# Patient Record
Sex: Female | Born: 1950 | Race: White | Hispanic: No | Marital: Married | Smoking: Current every day smoker
Health system: Southern US, Community
[De-identification: ages and names within clinical notes are randomized; demographics above are authoritative.]

## PROBLEM LIST (undated history)

## (undated) DIAGNOSIS — E785 Hyperlipidemia, unspecified: Secondary | ICD-10-CM

## (undated) DIAGNOSIS — M5430 Sciatica, unspecified side: Secondary | ICD-10-CM

## (undated) DIAGNOSIS — F17209 Nicotine dependence, unspecified, with unspecified nicotine-induced disorders: Secondary | ICD-10-CM

## (undated) DIAGNOSIS — F32A Depression, unspecified: Secondary | ICD-10-CM

## (undated) DIAGNOSIS — F329 Major depressive disorder, single episode, unspecified: Secondary | ICD-10-CM

## (undated) HISTORY — DX: Major depressive disorder, single episode, unspecified: F32.9

## (undated) HISTORY — PX: OTHER SURGICAL HISTORY: SHX169

## (undated) HISTORY — DX: Nicotine dependence, unspecified, with unspecified nicotine-induced disorders: F17.209

## (undated) HISTORY — DX: Sciatica, unspecified side: M54.30

## (undated) HISTORY — DX: Hyperlipidemia, unspecified: E78.5

## (undated) HISTORY — DX: Depression, unspecified: F32.A

---

## 1998-03-16 ENCOUNTER — Other Ambulatory Visit: Admission: RE | Admit: 1998-03-16 | Discharge: 1998-03-16 | Payer: Self-pay | Admitting: *Deleted

## 1999-08-09 ENCOUNTER — Other Ambulatory Visit: Admission: RE | Admit: 1999-08-09 | Discharge: 1999-08-09 | Payer: Self-pay | Admitting: *Deleted

## 2001-02-11 ENCOUNTER — Other Ambulatory Visit: Admission: RE | Admit: 2001-02-11 | Discharge: 2001-02-11 | Payer: Self-pay | Admitting: *Deleted

## 2005-10-13 ENCOUNTER — Ambulatory Visit (HOSPITAL_COMMUNITY): Admission: RE | Admit: 2005-10-13 | Discharge: 2005-10-13 | Payer: Self-pay | Admitting: Family Medicine

## 2005-10-13 ENCOUNTER — Emergency Department (HOSPITAL_COMMUNITY): Admission: EM | Admit: 2005-10-13 | Discharge: 2005-10-13 | Payer: Self-pay | Admitting: Family Medicine

## 2006-04-07 ENCOUNTER — Ambulatory Visit (HOSPITAL_BASED_OUTPATIENT_CLINIC_OR_DEPARTMENT_OTHER): Admission: RE | Admit: 2006-04-07 | Discharge: 2006-04-07 | Payer: Self-pay | Admitting: Orthopedic Surgery

## 2006-05-19 ENCOUNTER — Encounter: Admission: RE | Admit: 2006-05-19 | Discharge: 2006-05-19 | Payer: Self-pay | Admitting: Sports Medicine

## 2006-05-28 ENCOUNTER — Inpatient Hospital Stay (HOSPITAL_COMMUNITY): Admission: RE | Admit: 2006-05-28 | Discharge: 2006-05-30 | Payer: Self-pay | Admitting: Orthopedic Surgery

## 2008-03-22 ENCOUNTER — Emergency Department (HOSPITAL_COMMUNITY): Admission: EM | Admit: 2008-03-22 | Discharge: 2008-03-22 | Payer: Self-pay | Admitting: Family Medicine

## 2010-12-20 NOTE — Op Note (Signed)
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   PROCEDURES:  1. Left subtalar fusion.  2. Left calcaneocuboid fusion.   SURGEON:  Nadara Mustard, MD.   ANESTHESIA:  General plus popliteal block.   ESTIMATED BLOOD LOSS:  Minimal.   ANTIBIOTICS:  1 g of Kefzol.   DRAINS:  None.   COMPLICATIONS:  None.   TOURNIQUET TIME:  Esmarch to the ankle for approximately 51 minutes.   DISPOSITION:  To PACU in stable condition.   INDICATIONS FOR PROCEDURE:  The patient is a 60 year old woman who presents  7 months status post a calcaneus fracture.  This was treated nonoperatively  and the patient presents at this time for evaluation and treatment of the  subtalar arthrosis which has developed from her calcaneal fracture.  Radiographs and CT scan showed degenerative changes of the posterior facette  as well as the calcaneocuboid joint.  She has failed conservative care, has  pain with activities of daily living and wishes to proceed with a fusion at  this time.  Risks and benefits were discussed including infection,  neurovascular injury, persistent pain, nonhealing of the wound, nonhealing  of bone and need for additional surgery.  The patient states she understands  and wished to proceed.   DESCRIPTION OF PROCEDURE:  The patient was brought to OR room #4 and  underwent general anesthetic.  After adequate level of anesthesia was  obtained, the patient's left lower extremity was prepped  using DuraPrep,  draped into a sterile field.  An [name] was used to cover all exposed skin.  An oblique incision was made over the sinus tarsi.  This was carried down,  the extensor muscles were elevated and retracted distally.  The  calcaneocuboid joint was identified and articular cartilage was removed from  the calcaneocuboid joint.  The lateral wall which was prominent laterally  was excised to flatten the lateral wall of the calcaneus.  The posterior  facette was also then taken down with articular cartilage removed from the  posterior facette as well as the subtalar joint.  This was irrigated with  normal saline.  Her foot was held plantigrade with about 5 degrees of valgus  and a stab incision was made in the heel.  He had a guidewire was inserted  from the calcaneus into the talar head and C-arm fluoroscopy verified  reduction of the posterior facette.  The screw was then countersunk and a 60  mm screw was inserted across the posterior facette.  Attention was then  focused on the calcaneocuboid joint.  A 2.5  drill bit was placed across the  calcaneocuboid joint and a 50 mm 3.5 cortical screw was inserted.  C-arm  arthroscopy verified reduction AP and lateral planes of the posterior  facette as well as the calcaneocuboid joint.  The wound was irrigated with  normal saline.  C-arm fluoroscopy verified reduction.  The joint was then  packed with [place] demineralized bone matrix.  The muscle was then  reapproximated over the wound with this being closed with 2-0 Vicryl.  The  incision was closed with a modified vertical mattress with 2-0 nylon.  The  wound was covered Adaptic, orthopedic sponges, sterile Webril and a Coban  dressing.  The patient was extubated and taken to PACU in stable condition.      Nadara Mustard, MD  Electronically Signed     MVD/MEDQ  D:  05/28/2006  T:  05/29/2006  Job:  [id number]

## 2010-12-20 NOTE — Op Note (Signed)
NAMEARELIA, VOLPE                ACCOUNT NO.:  [id number]   MEDICAL RECORD NO.:  [id number]          PATIENT TYPE:  AMB   LOCATION:  DSC                          FACILITY:  [facility]   PHYSICIAN:  Katy Fitch. Sypher, M.D. 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   OPERATIONS:  1. Release of right thumb A1 pulley.  2. Release of the right first dorsal compartment at wrist.  Note these      were procedures through separate incisions.   OPERATIONS:  Dr. Josephine Igo.   ASSISTANT:  Annye Rusk, PA-C.   ANESTHESIA:  Infraclavicular block.  Supervising anesthesiologist Dr.  Krista Blue.   INDICATIONS:  [name] is a 60 year old woman who was self-referred for  evaluation and management of a painful right thumb and painful right wrist.  Clinical examination revealed rather dramatic swelling over the A1 pulley of  her right thumb, due to chronic stenosing tenosynovitis, and significant  swelling over her right first dorsal compartment.   She had pain with any attempted flexion of her right thumb IP joint, and  pain with ulnar deviation of her wrist and/or Finkelstein's maneuver.   After informed consent, she is brought to the operating at this time for  release of her first dorsal compartment and A1 pulley.   PROCEDURE:  Trenity Pha is brought to the operating room and placed in  supine position on the operating table.  Following light sedation, an  axillary block had been placed in the holding area by Dr. Krista Blue.  Within 30  minutes Ms. Laverne had excellent anesthesia in the right  arm.   The right arm was prepped with Betadine soap solution, and sterilely draped.  A pneumatic tourniquet was applied to the proximal right brachium.   Following exsanguination of the right arm with an Esmarch bandage, the  arterial tourniquet was inflated to 220 mmHg.  The procedure commenced with  a short transverse incision directly over the palpably thickened thumb A1  pulley.   Subcutaneous tissues were carefully divided, revealing a very large nodule  of hyalinized flexor sheath.  The area of inflammation and fibrosis measured  more than 4 mm in thickness.  With careful dissection, the radial proper  digital nerve of the thumb was gently retracted in a radial direction and  this thickened hyalinized mass carefully dissected.  The A1 pulley was  beneath the mass.  The mass was circumferentially dissected, followed by  incision in a longitudinal manner down the midline of the mass.  Ultimately,  the A1 pulley was identified and the flexor tendon identified.  There was a  narrow constriction where the flexor pollicis longus tendon had been trapped  for quite a while beneath the pulley.  The tendon was fraying due to a  compressive tendinopathy.  Areas of frank fragmentation of the tendon were  removed with scissors and rongeur dissection.  Thereafter, free IP motion  was recovered.  The wound was then repaired with intradermal 3-0 Prolene and  a Steri-Strip.   Attention directed the first dorsal compartment.   A short transverse incision was fashioned directly over the apex of the  swollen compartment.  Subcutaneous tissues were carefully divided, taking  care to identify the radial sensory branches.  These were dissected free  from the edematous subcutaneous tissues and retracted.  The anatomy of the  first dorsal compartment was obliterated by swollen hyalinized tissue.  This  was incised longitudinally, through a wall thickness of 4 mm, down to the  first dorsal compartment.   The abductor pollicis longus was noted to have  two tendon slips which were identified on the volar aspect of the  compartment, and a formal septum was encountered separating the extensor  pollicis brevis.  The septum between the EPB and APL tendons was completely  resected.  There was noted to be about 3 mm indentation on the abductor  pollicis longus tendons due to the severe stenosis created by the swelling  of the first dorsal compartment annular ligament.   Thereafter, free range of motion of the wrist was recovered.  The wound was  then repaired with intradermal 3-0 Prolene and a Steri-Strip.  There were no  apparent complications.   AFTERCARE:  She is provided prescription for Vicodin 5 mg one p.o. q.4-6  hours p.r.n. pain, 20 tablets without refill.      Katy Fitch Sypher, M.D.  Electronically Signed     RVS/MEDQ  D:  04/07/2006  T:  04/07/2006  Job:  [id number]   cc:   Katy Fitch. Sypher, M.D.

## 2010-12-20 NOTE — Discharge Summary (Signed)
[name], [name] NO.:  [id number]   MEDICAL RECORD NO.:  [id number]          PATIENT TYPE:  INP   LOCATION:  5021                         FACILITY:  [facility]   PHYSICIAN:  Nadara Mustard, MD     DATE OF BIRTH:  08/07/1950   DATE OF ADMISSION:  05/28/2006  DATE OF DISCHARGE:  05/30/2006                               DISCHARGE SUMMARY   FINAL DIAGNOSIS:  Calcaneal fracture with subtalar and calcaneocuboid  arthrosis.   PROCEDURE:  Left foot subtalar and calcaneocuboid fusion.   DISPOSITION:  Discharged to home in stable condition.  Follow up in the  office in two weeks.   HISTORY OF PRESENT ILLNESS:  The patient is a 60 year old woman, who is  seven months status post a calcaneus fracture which was treated closed.  She developed subtalar arthrosis and due to failed conservative care the  patient presented at this time for surgical intervention.  The patient's  hospital course was essentially unremarkable.  She underwent a subtalar  and calcaneocuboid fusion on October 25.  She received Kefzol for  infection prophylaxis.  The patient was able to ambulate independently  with protected weightbearing on the left.  The patient was discharged to  home in stable condition on October 27 with followup care in the office  in one week.      Nadara Mustard, MD  Electronically Signed     MVD/MEDQ  D:  06/26/2006  T:  06/26/2006  Job:  [id number]

## 2016-10-07 DIAGNOSIS — R030 Elevated blood-pressure reading, without diagnosis of hypertension: Secondary | ICD-10-CM | POA: Insufficient documentation

## 2016-10-07 DIAGNOSIS — F419 Anxiety disorder, unspecified: Secondary | ICD-10-CM | POA: Insufficient documentation

## 2016-10-07 DIAGNOSIS — E785 Hyperlipidemia, unspecified: Secondary | ICD-10-CM | POA: Insufficient documentation

## 2016-10-07 DIAGNOSIS — F17210 Nicotine dependence, cigarettes, uncomplicated: Secondary | ICD-10-CM | POA: Insufficient documentation

## 2017-05-26 ENCOUNTER — Encounter: Payer: Self-pay | Admitting: Family Medicine

## 2017-05-26 ENCOUNTER — Ambulatory Visit (INDEPENDENT_AMBULATORY_CARE_PROVIDER_SITE_OTHER): Payer: Medicare Other | Admitting: Family Medicine

## 2017-05-26 VITALS — BP 122/82 | HR 74 | Ht 64.25 in | Wt 128.1 lb

## 2017-05-26 DIAGNOSIS — R030 Elevated blood-pressure reading, without diagnosis of hypertension: Secondary | ICD-10-CM | POA: Diagnosis not present

## 2017-05-26 DIAGNOSIS — Z13220 Encounter for screening for lipoid disorders: Secondary | ICD-10-CM

## 2017-05-26 DIAGNOSIS — Z720 Tobacco use: Secondary | ICD-10-CM | POA: Diagnosis not present

## 2017-05-26 DIAGNOSIS — Z23 Encounter for immunization: Secondary | ICD-10-CM

## 2017-05-26 DIAGNOSIS — M5432 Sciatica, left side: Secondary | ICD-10-CM | POA: Diagnosis not present

## 2017-05-26 LAB — COMPREHENSIVE METABOLIC PANEL
ALBUMIN: 4.4 g/dL (ref 3.5–5.2)
ALK PHOS: 69 U/L (ref 39–117)
ALT: 13 U/L (ref 0–35)
AST: 22 U/L (ref 0–37)
BUN: 13 mg/dL (ref 6–23)
CHLORIDE: 101 meq/L (ref 96–112)
CO2: 32 mEq/L (ref 19–32)
CREATININE: 0.68 mg/dL (ref 0.40–1.20)
Calcium: 9.7 mg/dL (ref 8.4–10.5)
GFR: 92.25 mL/min (ref >60.00)
Glucose, Bld: 97 mg/dL (ref 70–99)
Potassium: 4 mEq/L (ref 3.5–5.1)
SODIUM: 138 meq/L (ref 135–145)
TOTAL PROTEIN: 7 g/dL (ref 6.0–8.3)
Total Bilirubin: 0.6 mg/dL (ref 0.2–1.2)

## 2017-05-26 LAB — CBC WITH DIFFERENTIAL/PLATELET
BASOS ABS: 0 10*3/uL (ref 0.0–0.1)
Basophils Relative: 0.4 % (ref 0.0–3.0)
EOS ABS: 0 10*3/uL (ref 0.0–0.7)
Eosinophils Relative: 0.4 % (ref 0.0–5.0)
HCT: 41.4 % (ref 36.0–46.0)
HEMOGLOBIN: 13.9 g/dL (ref 12.0–15.0)
Lymphocytes Relative: 20.7 % (ref 12.0–46.0)
Lymphs Abs: 2.2 10*3/uL (ref 0.7–4.0)
MCHC: 33.5 g/dL (ref 30.0–36.0)
MCV: 92.7 fl (ref 78.0–100.0)
MONO ABS: 0.8 10*3/uL (ref 0.1–1.0)
Monocytes Relative: 7.8 % (ref 3.0–12.0)
Neutro Abs: 7.7 10*3/uL (ref 1.4–7.7)
Neutrophils Relative %: 70.7 % (ref 43.0–77.0)
Platelets: 317 10*3/uL (ref 150.0–400.0)
RBC: 4.47 Mil/uL (ref 3.87–5.11)
RDW: 13.7 % (ref 11.5–15.5)
WBC: 10.8 10*3/uL — AB (ref 4.0–10.5)

## 2017-05-26 LAB — LIPID PANEL
Cholesterol: 148 mg/dL (ref 0–200)
HDL: 64.7 mg/dL (ref >39.00)
LDL CALC: 66 mg/dL (ref 0–99)
NONHDL: 83.3
Total CHOL/HDL Ratio: 2
Triglycerides: 85 mg/dL (ref 0.0–149.0)
VLDL: 17 mg/dL (ref 0.0–40.0)
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
## 2017-05-26 NOTE — Patient Instructions (Addendum)
Call your insurance company to ask about coverage for Cologaurd.  Then call back to the clinic if you would like me to order it.Immunization Schedule, Adult Recommended immunizations These include:  Influenza vaccine. ? All adults should be immunized every year. ? All adults, including pregnant women and people with hives-only allergy to eggs can receive the inactivated influenza vaccine (IIV) or recombinant influenza vaccine (RIV). ? Adults aged 65-64 years can receive the IIV or RIV. The RIV vaccine does not contain any egg protein. ? Adults aged 56 years or older can receive the high-dose or adjuvanted IIV.  Tetanus, diphtheria, and acellular pertussis (Td, Tdap) vaccine. ? Pregnant women should receive 1 dose of Tdap vaccine during each pregnancy. The dose should be obtained regardless of the length of time since the last dose. Immunization is preferred during the 27th to 36th week of gestation. ? An adult who has not previously received Tdap or who does not know his or her vaccine status should receive 1 dose of Tdap. This initial dose should be followed by tetanus and diphtheria toxoids (Td) booster doses every 10 years. ? Adults with an unknown or incomplete history of completing a 3-dose immunization series with Td-containing vaccines should begin or complete a primary immunization series including a Tdap dose. The first 2 doses should be received at least 4 weeks apart, and the third dose 6-12 months after the second dose. ? Adults should receive a Td booster every 10 years.  Varicella vaccine. ? An adult without evidence of immunity to varicella should receive 2 doses 4-8 weeks apart, or a second dose if he or she has previously received 1 dose. ? Pregnant females who do not have evidence of immunity should receive the first dose after pregnancy. This first dose should be obtained before leaving the health care facility. The second dose should be obtained 4-8 weeks after the first  dose. ? All healthcare workers should have evidence of immunity to varicella. ? Adults with cancer or those who are on therapy to suppress the immune system should not receive the varicella vaccine.  Human papillomavirus (HPV) vaccine. ? Females aged 13-26 years who have not received the vaccine previously should obtain the 3-dose series. Females should receive the second dose 1-2 months after the first dose, and the third dose 6 months after the first dose. ? The vaccine is not recommended for use in pregnant females. However, pregnancy testing is not needed before receiving a dose. If a female is found to be pregnant after receiving a dose, no treatment is needed. In that case, the remaining doses should be delayed until after the pregnancy. ? Males aged 43-21 years who have not received the vaccine previously should receive the 3-dose series. Males aged 22-26 years may also receive a 3 dose series. Males should receive the second dose 1-2 months after the first dose, and the third dose 6 months after the first dose. ? Adult females up to age 24 years and adult males up to age 66 years who initiated the HPV vaccine series before age 68 years and received 2 doses at least 5 months apart do not need an additional dose of the vaccine. ? Adult females up to age 75 years and adult female up to age 36 years who initiated the HPV vaccine series before 15 years but only received 1 dose or 2 doses less than 5 months apart should receive 1 additional dose of the vaccine. ? Immunization is recommended through the age of  14 years for any female who has sex with males and did not get any or all doses earlier. ? Immunization is recommended for any person with an immunocompromised condition through the age of 35 years if he or she did not get any or all doses earlier.  Zoster vaccine. ? One dose is recommended for adults aged 19 years or older unless certain conditions are present.  Measles, mumps, and rubella (MMR)  vaccine. ? Adults born before [birth year] generally are considered immune to measles and mumps. ? Adults born in [birth year] or later should have 1 or more doses of MMR vaccine unless there is a contraindication to the vaccine or there is laboratory evidence of immunity to each of the three diseases. ? A routine second dose of MMR vaccine should be obtained at least 28 days after the first dose for students attending postsecondary schools, health care workers, or international travelers. ? People who received inactivated measles vaccine or an unknown type of measles vaccine during 1963-1967 should be revaccinated with 1 or 2 doses of MMR vaccine. ? People who received inactivated mumps vaccine or an unknown type of mumps vaccine before 1979 and are at high risk for mumps infection should consider immunization with 2 doses of MMR vaccine. ? For females of childbearing age, rubella immunity should be determined. If there is no evidence of immunity, females who are not pregnant should receive 1 dose of MMR. If there is no evidence of immunity, females who are pregnant should receive 1 dose of MMR after pregnancy and before leaving the healthcare facility. ? Unvaccinated health care workers born before [birth year] who lack laboratory evidence of measles, mumps, or rubella immunity or laboratory confirmation of disease should consider 2 doses of MMR 18 days apart for measles or mumps, or 1 dose of MMR for rubella.  Pneumococcal vaccines. ? All adults aged 19 years and older should receive 13-valent pneumococcal conjugate vaccine (PCV13) followed by 23-valent pneumococcal polyscaccharide vaccine (PPSV23) at least 1 year after PCV13. ? An adult aged 54 years or older who has certain medical conditions and has not been previously immunized should receive 1 dose of PCV13 vaccine. This PCV13 should be followed with a dose of pneumococcal polysaccharide (PPSV23) vaccine. The PPSV23 vaccine dose should be obtained at least 8 weeks after  the dose of PCV13 vaccine. ? An adult aged 90 years or older who has certain medical conditions and previously received 1 or more doses of PPSV23 vaccine should receive 1 dose of PCV13. The PCV13 vaccine dose should be obtained 1 or more years after the last PPSV23 vaccine dose. ? PPSV23 vaccination should happen in all adults aged 19-64 who smoke cigarettes. ? People with an immunocompromised condition and certain other conditions should receive both PCV13 and PPSV23 vaccines. ? When indicated, people who have unknown immunization and have no record of immunization should receive PPSV23 vaccine. ? People who received 1-2 doses of PPSV23 before age 69 years should receive another dose of PPSV23 vaccine at age 40 years or later if at least 5 years have passed since the previous dose. ? Doses of PPSV23 are not needed for people immunized with PPSV23 at or after age 23 years.  Meningococcal vaccine. ? Adults with asplenia or persistent complement component deficiencies should receive 2 doses of quadrivalent meningococcal conjugate (MenACWY-D) vaccine. The doses should be obtained at least 2 months apart. Revaccination should occur every 5 years. A 2-dose or 3-dose series of serogroup B meningococcal (MenB)vaccine should also be  obtained. ? Microbiologists working with certain meningococcal bacteria, [employer] recruits, people at risk during an outbreak, and people who travel to or live in countries with a high rate of meningitis should be immunized. Revaccination is recommended every 5 years if the risk for infection remains. ? Adults with HIV infection who have not been previously vaccinated should receive a 2-dose MenACWY series with doses at least 2 months apart. If 1 dose was received previously, a second dose should be obtained at least 2 months later. Revaccination is recommended every 5 years. ? A first-year college student up through age 44 years who is living in a residence hall should receive a  dose if he or she did not receive a dose on or after his or her 16th birthday. ? Adults aged 37-23 years may receive 2 doses of MenB vaccine for short-term protection against most strains of serogroup B meningococcal disease.  Hepatitis A vaccine. ? Adults who wish to be protected from this disease, have certain high-risk conditions, work with hepatitis A-infected animals, work in hepatitis A research labs, or travel to or work in countries with a high rate of hepatitis A should be immunized. ? Adults who were previously unvaccinated and who anticipate close contact with an international adoptee during the first 60 days after arrival in the [place] from a country with a high rate of hepatitis A should be immunized. ? The vaccine may be given as a 2 or 3-dose series by itself or in combination with the hepatitis B vaccine (HepB).  Hepatitis B vaccine. ? Adults who wish to be protected from this disease, may be exposed to blood or other infectious body fluids, are household contacts or sex partners of hepatitis B positive people, are clients or workers in certain care facilities, or travel to or work in countries with a high rate of hepatitis B should be immunized. ? Adults with chronic liver disease, such as hepatitis C infection, cirrhosis, fatty liver disease, alcoholic liver disease, autoimmune hepatitis, and elevated liver chemistry levels should be vaccinated. ? Pregnant women who are at risk for hepatitis B virus infection during pregnancy should be immunized. ? The vaccine may be given as a 3-dose series by itself or in combination with the hepatitis A vaccine (HepA).  Haemophilus influenzae type b (Hib) vaccine. ? A previously unvaccinated person with asplenia or sickle cell disease or having a scheduled splenectomy should receive 1 dose of Hib vaccine. This should happen at least 14 days before the procedure. ? Regardless of previous immunization, a recipient of a hematopoietic stem  cell transplant should receive a 3-dose series, with at least 4 weeks between doses, 6-12 months after his or her successful transplant. ? Hib vaccine is not recommended for adults with HIV infection.  This information is not intended to replace advice given to you by your health care provider. Make sure you discuss any questions you have with your health care provider. Document Released: 10/11/2003 Document Revised: 04/02/2016 Document Reviewed: 04/02/2016 Elsevier Interactive Patient Education  2018 [place] 65 Years and Older, Female Preventive care refers to lifestyle choices and visits with your health care provider that can promote health and wellness. What does preventive care include?  A yearly physical exam. This is also called an annual well check.  Dental exams once or twice a year.  Routine eye exams. Ask your health care provider how often you should have your eyes checked.  Personal lifestyle choices, including: ? Daily care of  your teeth and gums. ? Regular physical activity. ? Eating a healthy diet. ? Avoiding tobacco and drug use. ? Limiting alcohol use. ? Practicing safe sex. ? Taking low-dose aspirin every day. ? Taking vitamin and mineral supplements as recommended by your health care provider. What happens during an annual well check? The services and screenings done by your health care provider during your annual well check will depend on your age, overall health, lifestyle risk factors, and family history of disease. Counseling Your health care provider may ask you questions about your:  Alcohol use.  Tobacco use.  Drug use.  Emotional well-being.  Home and relationship well-being.  Sexual activity.  Eating habits.  History of falls.  Memory and ability to understand (cognition).  Work and work Statistician.  Reproductive health.  Screening You may have the following tests or measurements:  Height, weight, and  BMI.  Blood pressure.  Lipid and cholesterol levels. These may be checked every 5 years, or more frequently if you are over 37 years old.  Skin check.  Lung cancer screening. You may have this screening every year starting at age 21 if you have a 30-pack-year history of smoking and currently smoke or have quit within the past 15 years.  Fecal occult blood test (FOBT) of the stool. You may have this test every year starting at age 55.  Flexible sigmoidoscopy or colonoscopy. You may have a sigmoidoscopy every 5 years or a colonoscopy every 10 years starting at age 57.  Hepatitis C blood test.  Hepatitis B blood test.  Sexually transmitted disease (STD) testing.  Diabetes screening. This is done by checking your blood sugar (glucose) after you have not eaten for a while (fasting). You may have this done every 1-3 years.  Bone density scan. This is done to screen for osteoporosis. You may have this done starting at age 72.  Mammogram. This may be done every 1-2 years. Talk to your health care provider about how often you should have regular mammograms.  Talk with your health care provider about your test results, treatment options, and if necessary, the need for more tests. Vaccines Your health care provider may recommend certain vaccines, such as:  Influenza vaccine. This is recommended every year.  Tetanus, diphtheria, and acellular pertussis (Tdap, Td) vaccine. You may need a Td booster every 10 years.  Varicella vaccine. You may need this if you have not been vaccinated.  Zoster vaccine. You may need this after age 8.  Measles, mumps, and rubella (MMR) vaccine. You may need at least one dose of MMR if you were born in 1957 or later. You may also need a second dose.  Pneumococcal 13-valent conjugate (PCV13) vaccine. One dose is recommended after age 56.  Pneumococcal polysaccharide (PPSV23) vaccine. One dose is recommended after age 31.  Meningococcal vaccine. You may need  this if you have certain conditions.  Hepatitis A vaccine. You may need this if you have certain conditions or if you travel or work in places where you may be exposed to hepatitis A.  Hepatitis B vaccine. You may need this if you have certain conditions or if you travel or work in places where you may be exposed to hepatitis B.  Haemophilus influenzae type b (Hib) vaccine. You may need this if you have certain conditions.  Talk to your health care provider about which screenings and vaccines you need and how often you need them. This information is not intended to replace advice given to you  by your health care provider. Make sure you discuss any questions you have with your health care provider. Document Released: 08/17/2015 Document Revised: 04/09/2016 Document Reviewed: 05/22/2015 Elsevier Interactive Patient Education  2017 [employer]  Sciatica Sciatica is pain, numbness, weakness, or tingling along your sciatic nerve. The sciatic nerve starts in the lower back and goes down the back of each leg. Sciatica happens when this nerve is pinched or has pressure put on it. Sciatica usually goes away on its own or with treatment. Sometimes, sciatica may keep coming back (recur). Follow these instructions at home: Medicines  Take over-the-counter and prescription medicines only as told by your doctor.  Do not drive or use heavy machinery while taking prescription pain medicine. Managing pain  If directed, put ice on the affected area. ? Put ice in a plastic bag. ? Place a towel between your skin and the bag. ? Leave the ice on for 20 minutes, 2-3 times a day.  After icing, apply heat to the affected area before you exercise or as often as told by your doctor. Use the heat source that your doctor tells you to use, such as a moist heat pack or a heating pad. ? Place a towel between your skin and the heat source. ? Leave the heat on for 20-30 minutes. ? Remove the heat if your skin turns  bright red. This is especially important if you are unable to feel pain, heat, or cold. You may have a greater risk of getting burned. Activity  Return to your normal activities as told by your doctor. Ask your doctor what activities are safe for you. ? Avoid activities that make your sciatica worse.  Take short rests during the day. Rest in a lying or standing position. This is usually better than sitting to rest. ? When you rest for a long time, do some physical activity or stretching between periods of rest. ? Avoid sitting for a long time without moving. Get up and move around at least one time each hour.  Exercise and stretch regularly, as told by your doctor.  Do not lift anything that is heavier than 10 lb (4.5 kg) while you have symptoms of sciatica. ? Avoid lifting heavy things even when you do not have symptoms. ? Avoid lifting heavy things over and over.  When you lift objects, always lift in a way that is safe for your body. To do this, you should: ? Bend your knees. ? Keep the object close to your body. ? Avoid twisting. General instructions  Use good posture. ? Avoid leaning forward when you are sitting. ? Avoid hunching over when you are standing.  Stay at a healthy weight.  Wear comfortable shoes that support your feet. Avoid wearing high heels.  Avoid sleeping on a mattress that is too soft or too hard. You might have less pain if you sleep on a mattress that is firm enough to support your back.  Keep all follow-up visits as told by your doctor. This is important. Contact a doctor if:  You have pain that: ? Wakes you up when you are sleeping. ? Gets worse when you lie down. ? Is worse than the pain you have had in the past. ? Lasts longer than 4 weeks.  You lose weight for without trying. Get help right away if:  You cannot control when you pee (urinate) or poop (have a bowel movement).  You have weakness in any of these areas and it gets worse. ?  Lower  back. ? Lower belly (pelvis). ? Butt (buttocks). ? Legs.  You have redness or swelling of your back.  You have a burning feeling when you pee. This information is not intended to replace advice given to you by your health care provider. Make sure you discuss any questions you have with your health care provider. Document Released: 04/29/2008 Document Revised: 12/27/2015 Document Reviewed: 03/30/2015 Elsevier Interactive Patient Education  2018 [employer].  Sciatica Rehab Ask your health care provider which exercises are safe for you. Do exercises exactly as told by your health care provider and adjust them as directed. It is normal to feel mild stretching, pulling, tightness, or discomfort as you do these exercises, but you should stop right away if you feel sudden pain or your pain gets worse.Do not begin these exercises until told by your health care provider. Stretching and range of motion exercises These exercises warm up your muscles and joints and improve the movement and flexibility of your hips and your back. These exercises also help to relieve pain, numbness, and tingling. Exercise A: Sciatic nerve glide 1. Sit in a chair with your head facing down toward your chest. Place your hands behind your back. Let your shoulders slump forward. 2. Slowly straighten one of your knees while you tilt your head back as if you are looking toward the ceiling. Only straighten your leg as far as you can without making your symptoms worse. 3. Hold for __________ seconds. 4. Slowly return to the starting position. 5. Repeat with your other leg. Repeat __________ times. Complete this exercise __________ times a day. Exercise B: Knee to chest with hip adduction and internal rotation  1. Lie on your back on a firm surface with both legs straight. 2. Bend one of your knees and move it up toward your chest until you feel a gentle stretch in your lower back and buttock. Then, move your knee toward the  shoulder that is on the opposite side from your leg. ? Hold your leg in this position by holding onto the front of your knee. 3. Hold for __________ seconds. 4. Slowly return to the starting position. 5. Repeat with your other leg. Repeat __________ times. Complete this exercise __________ times a day. Exercise C: Prone extension on elbows  1. Lie on your abdomen on a firm surface. A bed may be too soft for this exercise. 2. Prop yourself up on your elbows. 3. Use your arms to help lift your chest up until you feel a gentle stretch in your abdomen and your lower back. ? This will place some of your body weight on your elbows. If this is uncomfortable, try stacking pillows under your chest. ? Your hips should stay down, against the surface that you are lying on. Keep your hip and back muscles relaxed. 4. Hold for __________ seconds. 5. Slowly relax your upper body and return to the starting position. Repeat __________ times. Complete this exercise __________ times a day. Strengthening exercises These exercises build strength and endurance in your back. Endurance is the ability to use your muscles for a long time, even after they get tired. Exercise D: Pelvic tilt 1. Lie on your back on a firm surface. Bend your knees and keep your feet flat. 2. Tense your abdominal muscles. Tip your pelvis up toward the ceiling and flatten your lower back into the floor. ? To help with this exercise, you may place a small towel under your lower back and try to push  your back into the towel. 3. Hold for __________ seconds. 4. Let your muscles relax completely before you repeat this exercise. Repeat __________ times. Complete this exercise __________ times a day. Exercise E: Alternating arm and leg raises  1. Get on your hands and knees on a firm surface. If you are on a hard floor, you may want to use padding to cushion your knees, such as an exercise mat. 2. Line up your arms and legs. Your hands should be  below your shoulders, and your knees should be below your hips. 3. Lift your left leg behind you. At the same time, raise your right arm and straighten it in front of you. ? Do not lift your leg higher than your hip. ? Do not lift your arm higher than your shoulder. ? Keep your abdominal and back muscles tight. ? Keep your hips facing the ground. ? Do not arch your back. ? Keep your balance carefully, and do not hold your breath. 4. Hold for __________ seconds. 5. Slowly return to the starting position and repeat with your right leg and your left arm. Repeat __________ times. Complete this exercise __________ times a day. Posture and body mechanics  Body mechanics refers to the movements and positions of your body while you do your daily activities. Posture is part of body mechanics. Good posture and healthy body mechanics can help to relieve stress in your body's tissues and joints. Good posture means that your spine is in its natural S-curve position (your spine is neutral), your shoulders are pulled back slightly, and your head is not tipped forward. The following are general guidelines for applying improved posture and body mechanics to your everyday activities. Standing   When standing, keep your spine neutral and your feet about hip-width apart. Keep a slight bend in your knees. Your ears, shoulders, and hips should line up.  When you do a task in which you stand in one place for a long time, place one foot up on a stable object that is 2-4 inches (5-10 cm) high, such as a footstool. This helps keep your spine neutral. Sitting   When sitting, keep your spine neutral and keep your feet flat on the floor. Use a footrest, if necessary, and keep your thighs parallel to the floor. Avoid rounding your shoulders, and avoid tilting your head forward.  When working at a desk or a computer, keep your desk at a height where your hands are slightly lower than your elbows. Slide your chair under  your desk so you are close enough to maintain good posture.  When working at a computer, place your monitor at a height where you are looking straight ahead and you do not have to tilt your head forward or downward to look at the screen. Resting   When lying down and resting, avoid positions that are most painful for you.  If you have pain with activities such as sitting, bending, stooping, or squatting (flexion-based activities), lie in a position in which your body does not bend very much. For example, avoid curling up on your side with your arms and knees near your chest (fetal position).  If you have pain with activities such as standing for a long time or reaching with your arms (extension-based activities), lie with your spine in a neutral position and bend your knees slightly. Try the following positions: ? Lying on your side with a pillow between your knees. ? Lying on your back with a pillow under your  knees. Lifting   When lifting objects, keep your feet at least shoulder-width apart and tighten your abdominal muscles.  Bend your knees and hips and keep your spine neutral. It is important to lift using the strength of your legs, not your back. Do not lock your knees straight out.  Always ask for help to lift heavy or awkward objects. This information is not intended to replace advice given to you by your health care provider. Make sure you discuss any questions you have with your health care provider. Document Released: 07/21/2005 Document Revised: 03/27/2016 Document Reviewed: 04/06/2015 Elsevier Interactive Patient Education  [employer].

## 2017-06-01 ENCOUNTER — Telehealth: Payer: Self-pay | Admitting: Family Medicine

## 2017-06-01 NOTE — Telephone Encounter (Signed)
Please advise 

## 2017-06-01 NOTE — Telephone Encounter (Signed)
° ° ° ° °  Pt call to say she is having a lot of sciatica pain and is asking if she need to come in for an appt or if something can be called in for her

## 2017-06-02 NOTE — Telephone Encounter (Signed)
Pt returning your call

## 2017-06-02 NOTE — Telephone Encounter (Signed)
Left a VM for patient to give the office a call back.  

## 2017-06-02 NOTE — Telephone Encounter (Signed)
Treatment for sciatica includes Tylenol or NSAIDs (ibuprofen, Aleeve, etc).  Resting/ reducing activities or movements that cause pain are also recommended.  If pt wants a short course of steroids we can send it to the pharmacy.

## 2017-06-03 NOTE — Telephone Encounter (Signed)
Left a VM for patient to give the office a call back.  

## 2017-06-03 NOTE — Telephone Encounter (Signed)
Spoke with patient and she would like to try a short term course of steroids sent to pharmacy.

## 2017-06-04 ENCOUNTER — Other Ambulatory Visit: Payer: Self-pay | Admitting: Family Medicine

## 2017-06-04 MED ORDER — PREDNISONE 10 MG PO TABS: ORAL_TABLET | ORAL | 0 refills | Status: DC

## 2017-06-04 NOTE — Telephone Encounter (Signed)
5 day steroid taper sent to pharmacy.

## 2017-06-04 NOTE — Telephone Encounter (Signed)
Spoke with patient regarding medication being sent to the pharmacy. Patient understood nothing further needed

## 2017-12-07 ENCOUNTER — Other Ambulatory Visit: Payer: Self-pay | Admitting: Family Medicine

## 2017-12-09 NOTE — Telephone Encounter (Signed)
Please advise. You have not filled this medication for patient before?

## 2018-02-26 DIAGNOSIS — H5203 Hypermetropia, bilateral: Secondary | ICD-10-CM | POA: Diagnosis not present

## 2018-04-06 DIAGNOSIS — F411 Generalized anxiety disorder: Secondary | ICD-10-CM | POA: Diagnosis not present

## 2018-04-06 DIAGNOSIS — F331 Major depressive disorder, recurrent, moderate: Secondary | ICD-10-CM | POA: Diagnosis not present

## 2018-05-27 ENCOUNTER — Encounter: Payer: Self-pay | Admitting: Family Medicine

## 2018-05-27 ENCOUNTER — Ambulatory Visit (INDEPENDENT_AMBULATORY_CARE_PROVIDER_SITE_OTHER): Payer: Medicare Other | Admitting: Family Medicine

## 2018-05-27 VITALS — BP 120/78 | HR 60 | Temp 98.3°F | Wt 116.0 lb

## 2018-05-27 DIAGNOSIS — Z13220 Encounter for screening for lipoid disorders: Secondary | ICD-10-CM | POA: Diagnosis not present

## 2018-05-27 DIAGNOSIS — Z0000 Encounter for general adult medical examination without abnormal findings: Secondary | ICD-10-CM | POA: Diagnosis not present

## 2018-05-27 DIAGNOSIS — Z23 Encounter for immunization: Secondary | ICD-10-CM | POA: Diagnosis not present

## 2018-05-27 LAB — CBC WITH DIFFERENTIAL/PLATELET
BASOS PCT: 0.5 % (ref 0.0–3.0)
Basophils Absolute: 0 10*3/uL (ref 0.0–0.1)
EOS ABS: 0.1 10*3/uL (ref 0.0–0.7)
Eosinophils Relative: 0.9 % (ref 0.0–5.0)
HCT: 42.2 % (ref 36.0–46.0)
Hemoglobin: 14.1 g/dL (ref 12.0–15.0)
LYMPHS ABS: 1.6 10*3/uL (ref 0.7–4.0)
Lymphocytes Relative: 17.7 % (ref 12.0–46.0)
MCHC: 33.4 g/dL (ref 30.0–36.0)
MCV: 91.8 fl (ref 78.0–100.0)
MONO ABS: 0.7 10*3/uL (ref 0.1–1.0)
Monocytes Relative: 7.9 % (ref 3.0–12.0)
NEUTROS ABS: 6.8 10*3/uL (ref 1.4–7.7)
NEUTROS PCT: 73 % (ref 43.0–77.0)
PLATELETS: 313 10*3/uL (ref 150.0–400.0)
RBC: 4.59 Mil/uL (ref 3.87–5.11)
RDW: 14 % (ref 11.5–15.5)
WBC: 9.3 10*3/uL (ref 4.0–10.5)

## 2018-05-27 LAB — BASIC METABOLIC PANEL
BUN: 13 mg/dL (ref 6–23)
CALCIUM: 9.5 mg/dL (ref 8.4–10.5)
CO2: 32 mEq/L (ref 19–32)
CREATININE: 0.78 mg/dL (ref 0.40–1.20)
Chloride: 102 mEq/L (ref 96–112)
GFR: 78.5 mL/min (ref >60.00)
Glucose, Bld: 94 mg/dL (ref 70–99)
Potassium: 3.9 mEq/L (ref 3.5–5.1)
Sodium: 140 mEq/L (ref 135–145)

## 2018-05-27 LAB — LIPID PANEL
CHOLESTEROL: 149 mg/dL (ref 0–200)
HDL: 61.6 mg/dL (ref >39.00)
LDL Cholesterol: 70 mg/dL (ref 0–99)
NonHDL: 87.79
Total CHOL/HDL Ratio: 2
Triglycerides: 89 mg/dL (ref 0.0–149.0)
VLDL: 17.8 mg/dL (ref 0.0–40.0)

## 2018-05-27 NOTE — Progress Notes (Signed)
Subjective:     [name] is a 67 y.o. female and is here for a comprehensive physical exam. The patient reports no problems.  Still taking Lexapro 20 mg daily.  Social History   Socioeconomic History  . Marital status: Married    Spouse name: Not on file  . Number of children: Not on file  . Years of education: Not on file  . Highest education level: Not on file  Occupational History  . Not on file  Social Needs  . Financial resource strain: Not on file  . Food insecurity:    Worry: Not on file    Inability: Not on file  . Transportation needs:    Medical: Not on file    Non-medical: Not on file  Tobacco Use  . Smoking status: Current Every Day Smoker    Types: Cigarettes  . Smokeless tobacco: Never Used  Substance and Sexual Activity  . Alcohol use: No  . Drug use: No  . Sexual activity: Not on file  Lifestyle  . Physical activity:    Days per week: Not on file    Minutes per session: Not on file  . Stress: Not on file  Relationships  . Social connections:    Talks on phone: Not on file    Gets together: Not on file    Attends religious service: Not on file    Active member of club or organization: Not on file    Attends meetings of clubs or organizations: Not on file    Relationship status: Not on file  . Intimate partner violence:    Fear of current or ex partner: Not on file    Emotionally abused: Not on file    Physically abused: Not on file    Forced sexual activity: Not on file  Other Topics Concern  . Not on file  Social History Narrative  . Not on file   Health Maintenance  Topic Date Due  . Hepatitis C Screening  1951-01-30  . TETANUS/TDAP  03/28/1971  . MAMMOGRAM  03/27/2002  . COLONOSCOPY  03/27/2002  . DEXA SCAN  03/27/2017  . INFLUENZA VACCINE  03/04/2018  . PNA vac Low Risk Adult (2 of 2 - PPSV23) 05/26/2018    The following portions of the patient's history were reviewed and updated as appropriate: allergies, current medications,  past family history, past medical history, past social history, past surgical history and problem list.  Review of Systems Pertinent items noted in HPI and remainder of comprehensive ROS otherwise negative.   Objective:    BP 120/78 (BP Location: Left Arm, Patient Position: Sitting, Cuff Size: Normal)   Pulse 60   Temp 98.3 F (36.8 C) (Oral)   Wt 116 lb (52.6 kg)   SpO2 97%   BMI 19.76 kg/m  General appearance: alert, cooperative and no distress Head: Normocephalic, without obvious abnormality, atraumatic Eyes: conjunctivae/corneas clear. PERRL, EOM's intact. Fundi benign. Ears: normal TM's and external ear canals both ears Nose: Nares normal. Septum midline. Mucosa normal. No drainage or sinus tenderness. Throat: lips, mucosa, and tongue normal; teeth and gums normal Neck: no adenopathy, no carotid bruit, no JVD, supple, symmetrical, trachea midline and thyroid not enlarged, symmetric, no tenderness/mass/nodules Lungs: clear to auscultation bilaterally Heart: regular rate and rhythm, S1, S2 normal, no murmur, click, rub or gallop Abdomen: soft, non-tender; bowel sounds normal; no masses,  no organomegaly Extremities: extremities normal, atraumatic, no cyanosis or edema Skin: Skin color, texture, turgor normal. No rashes or  lesions Neurologic: Alert and oriented X 3, normal strength and tone. Normal symmetric reflexes. Normal coordination and gait    Assessment:    Healthy female exam.      Plan:     Anticipatory guidance given including wearing seatbelts, smoke detectors in the home, increasing physical activity, increasing p.o. intake of water and vegetables. -will obtain labs -pt declines colonoscopy. Will offer cologaurd. -pt advised to schedule mammogram -Influenza and PPSV 23 given this visit. -given handout -next CPE in 1 yr. See After Visit Summary for Counseling Recommendations    F/u prn  Grier Mitts, MD

## 2018-05-27 NOTE — Addendum Note (Signed)
Addended by: Wyvonne Lenz on: 05/27/2018 08:46 AM   Modules accepted: Orders

## 2018-09-14 ENCOUNTER — Encounter: Payer: Self-pay | Admitting: Internal Medicine

## 2018-09-14 ENCOUNTER — Ambulatory Visit: Payer: Medicare Other | Admitting: Internal Medicine

## 2018-09-14 VITALS — BP 128/66 | HR 82 | Temp 98.8°F | Wt 119.7 lb

## 2018-09-14 DIAGNOSIS — R300 Dysuria: Secondary | ICD-10-CM

## 2018-09-14 DIAGNOSIS — R3989 Other symptoms and signs involving the genitourinary system: Secondary | ICD-10-CM

## 2018-09-14 LAB — POCT URINALYSIS DIPSTICK
BILIRUBIN UA: NEGATIVE
GLUCOSE UA: NEGATIVE
Ketones, UA: NEGATIVE
Nitrite, UA: NEGATIVE
Protein, UA: POSITIVE — AB
Spec Grav, UA: >=1.03 — AB (ref 1.010–1.025)
Urobilinogen, UA: 1 E.U./dL
pH, UA: 6 (ref 5.0–8.0)

## 2018-09-14 MED ORDER — NITROFURANTOIN MONOHYD MACRO 100 MG PO CAPS: 100.0000 mg | ORAL_CAPSULE | Freq: Two times a day (BID) | ORAL | 0 refills | Status: AC

## 2018-09-14 NOTE — Patient Instructions (Signed)
Take antibiotic  For probably uti  Fu if  persistent or progressive .  Expect to feel better in 1-2 days .    Urinary Tract Infection, Adult  A urinary tract infection (UTI) is an infection of any part of the urinary tract. The urinary tract includes the kidneys, ureters, bladder, and urethra. These organs make, store, and get rid of urine in the body. Your health care provider may use other names to describe the infection. An upper UTI affects the ureters and kidneys (pyelonephritis). A lower UTI affects the bladder (cystitis) and urethra (urethritis). What are the causes? Most urinary tract infections are caused by bacteria in your genital area, around the entrance to your urinary tract (urethra). These bacteria grow and cause inflammation of your urinary tract. What increases the risk? You are more likely to develop this condition if:  You have a urinary catheter that stays in place (indwelling).  You are not able to control when you urinate or have a bowel movement (you have incontinence).  You are female and you: ? Use a spermicide or diaphragm for birth control. ? Have low estrogen levels. ? Are pregnant.  You have certain genes that increase your risk (genetics).  You are sexually active.  You take antibiotic medicines.  You have a condition that causes your flow of urine to slow down, such as: ? An enlarged prostate, if you are female. ? Blockage in your urethra (stricture). ? A kidney stone. ? A nerve condition that affects your bladder control (neurogenic bladder). ? Not getting enough to drink, or not urinating often.  You have certain medical conditions, such as: ? Diabetes. ? A weak disease-fighting system (immunesystem). ? Sickle cell disease. ? Gout. ? Spinal cord injury. What are the signs or symptoms? Symptoms of this condition include:  Needing to urinate right away (urgently).  Frequent urination or passing small amounts of urine frequently.  Pain  or burning with urination.  Blood in the urine.  Urine that smells bad or unusual.  Trouble urinating.  Cloudy urine.  Vaginal discharge, if you are female.  Pain in the abdomen or the lower back. You may also have:  Vomiting or a decreased appetite.  Confusion.  Irritability or tiredness.  A fever.  Diarrhea. The first symptom in older adults may be confusion. In some cases, they may not have any symptoms until the infection has worsened. How is this diagnosed? This condition is diagnosed based on your medical history and a physical exam. You may also have other tests, including:  Urine tests.  Blood tests.  Tests for sexually transmitted infections (STIs). If you have had more than one UTI, a cystoscopy or imaging studies may be done to determine the cause of the infections. How is this treated? Treatment for this condition includes:  Antibiotic medicine.  Over-the-counter medicines to treat discomfort.  Drinking enough water to stay hydrated. If you have frequent infections or have other conditions such as a kidney stone, you may need to see a health care provider who specializes in the urinary tract (urologist). In rare cases, urinary tract infections can cause sepsis. Sepsis is a life-threatening condition that occurs when the body responds to an infection. Sepsis is treated in the hospital with IV antibiotics, fluids, and other medicines. Follow these instructions at home:  Medicines  Take over-the-counter and prescription medicines only as told by your health care provider.  If you were prescribed an antibiotic medicine, take it as told by your health care  provider. Do not stop using the antibiotic even if you start to feel better. General instructions  Make sure you: ? Empty your bladder often and completely. Do not hold urine for long periods of time. ? Empty your bladder after sex. ? Wipe from front to back after a bowel movement if you are female.  Use each tissue one time when you wipe.  Drink enough fluid to keep your urine pale yellow.  Keep all follow-up visits as told by your health care provider. This is important. Contact a health care provider if:  Your symptoms do not get better after 1-2 days.  Your symptoms go away and then return. Get help right away if you have:  Severe pain in your back or your lower abdomen.  A fever.  Nausea or vomiting. Summary  A urinary tract infection (UTI) is an infection of any part of the urinary tract, which includes the kidneys, ureters, bladder, and urethra.  Most urinary tract infections are caused by bacteria in your genital area, around the entrance to your urinary tract (urethra).  Treatment for this condition often includes antibiotic medicines.  If you were prescribed an antibiotic medicine, take it as told by your health care provider. Do not stop using the antibiotic even if you start to feel better.  Keep all follow-up visits as told by your health care provider. This is important. This information is not intended to replace advice given to you by your health care provider. Make sure you discuss any questions you have with your health care provider. Document Released: 04/30/2005 Document Revised: 01/28/2018 Document Reviewed: 01/28/2018 Elsevier Interactive Patient Education  2019 [employer].

## 2018-09-14 NOTE — Progress Notes (Signed)
Chief Complaint  Patient presents with  . Urinary Tract Infection    this morning. pt has burning and blood in urine this morning.     HPI: [name] 68 y.o. come in for sda   PCP appt NA Acute onset one day  As above  Hx of same   Stress gets uti    Last one 2-3 years.  Ago no hx of renal disease or  pyelo No fever  ROS: See pertinent positives and negatives per HPI.   No vomiting diarrhea    Past Medical History:  Diagnosis Date  . Depression   . Hyperlipidemia   . Sciatica     No family history on file.  Social History   Socioeconomic History  . Marital status: Married    Spouse name: Not on file  . Number of children: Not on file  . Years of education: Not on file  . Highest education level: Not on file  Occupational History  . Not on file  Social Needs  . Financial resource strain: Not on file  . Food insecurity:    Worry: Not on file    Inability: Not on file  . Transportation needs:    Medical: Not on file    Non-medical: Not on file  Tobacco Use  . Smoking status: Current Every Day Smoker    Types: Cigarettes  . Smokeless tobacco: Never Used  Substance and Sexual Activity  . Alcohol use: No  . Drug use: No  . Sexual activity: Not on file  Lifestyle  . Physical activity:    Days per week: Not on file    Minutes per session: Not on file  . Stress: Not on file  Relationships  . Social connections:    Talks on phone: Not on file    Gets together: Not on file    Attends religious service: Not on file    Active member of club or organization: Not on file    Attends meetings of clubs or organizations: Not on file    Relationship status: Not on file  Other Topics Concern  . Not on file  Social History Narrative  . Not on file    Outpatient Medications Prior to Visit  Medication Sig Dispense Refill  . ARIPIPRAZOLE, SENSOR, PO Take 0.25 mg by mouth daily.    Marland Kitchen atorvastatin (LIPITOR) 10 MG tablet TAKE 1 TABLET BY MOUTH EVERY DAY 90 tablet 3    . Calcium Citrate-Vitamin D (CALCIUM CITRATE + D PO) Take by mouth.    . escitalopram (LEXAPRO) 20 MG tablet Take 20 mg by mouth daily.     . LUTEIN PO Take by mouth.    . risperiDONE (RISPERDAL) 0.25 MG tablet Take 0.25 mg by mouth at bedtime.     No facility-administered medications prior to visit.      EXAM:  BP 128/66 (BP Location: Right Arm, Patient Position: Sitting, Cuff Size: Normal)   Pulse 82   Temp 98.8 F (37.1 C) (Oral)   Wt 119 lb 11.2 oz (54.3 kg)   BMI 20.39 kg/m   Body mass index is 20.39 kg/m.  GENERAL: vitals reviewed and listed above, alert, oriented, appears well hydrated and in no acute distress HEENT: atraumatic, conjunctiva  clear, no obvious abnormalities on inspection of external nose and ears Abdomen:  Sof,t normal bowel sounds without hepatosplenomegaly, no guarding rebound or masses no CVA tenderness MS: moves all extremities without noticeable focal  abnormality PSYCH: pleasant and cooperative,  Lab Results  Component Value Date   WBC 9.3 05/27/2018   HGB 14.1 05/27/2018   HCT 42.2 05/27/2018   PLT 313.0 05/27/2018   GLUCOSE 94 05/27/2018   CHOL 149 05/27/2018   TRIG 89.0 05/27/2018   HDL 61.60 05/27/2018   LDLCALC 70 05/27/2018   ALT 13 05/26/2017   AST 22 05/26/2017   NA 140 05/27/2018   K 3.9 05/27/2018   CL 102 05/27/2018   CREATININE 0.78 05/27/2018   BUN 13 05/27/2018   CO2 32 05/27/2018   BP Readings from Last 3 Encounters:  09/14/18 128/66  05/27/18 120/78  05/26/17 122/82   UA 1+ leuk and pos heme3+  ASSESSMENT AND PLAN:  Discussed the following assessment and plan:  Possible urinary tract infection - Plan: POCT urinalysis dipstick, Culture, Urine  Dysuria Appears uncomplicated   Add rx Ucx pending   Expectant management.  -Patient advised to return or notify health care team  if  new concerns arise.  Patient Instructions  Take antibiotic  For probably uti  Fu if  persistent or progressive .  Expect to feel  better in 1-2 days .    Urinary Tract Infection, Adult  A urinary tract infection (UTI) is an infection of any part of the urinary tract. The urinary tract includes the kidneys, ureters, bladder, and urethra. These organs make, store, and get rid of urine in the body. Your health care provider may use other names to describe the infection. An upper UTI affects the ureters and kidneys (pyelonephritis). A lower UTI affects the bladder (cystitis) and urethra (urethritis). What are the causes? Most urinary tract infections are caused by bacteria in your genital area, around the entrance to your urinary tract (urethra). These bacteria grow and cause inflammation of your urinary tract. What increases the risk? You are more likely to develop this condition if:  You have a urinary catheter that stays in place (indwelling).  You are not able to control when you urinate or have a bowel movement (you have incontinence).  You are female and you: ? Use a spermicide or diaphragm for birth control. ? Have low estrogen levels. ? Are pregnant.  You have certain genes that increase your risk (genetics).  You are sexually active.  You take antibiotic medicines.  You have a condition that causes your flow of urine to slow down, such as: ? An enlarged prostate, if you are female. ? Blockage in your urethra (stricture). ? A kidney stone. ? A nerve condition that affects your bladder control (neurogenic bladder). ? Not getting enough to drink, or not urinating often.  You have certain medical conditions, such as: ? Diabetes. ? A weak disease-fighting system (immunesystem). ? Sickle cell disease. ? Gout. ? Spinal cord injury. What are the signs or symptoms? Symptoms of this condition include:  Needing to urinate right away (urgently).  Frequent urination or passing small amounts of urine frequently.  Pain or burning with urination.  Blood in the urine.  Urine that smells bad or  unusual.  Trouble urinating.  Cloudy urine.  Vaginal discharge, if you are female.  Pain in the abdomen or the lower back. You may also have:  Vomiting or a decreased appetite.  Confusion.  Irritability or tiredness.  A fever.  Diarrhea. The first symptom in older adults may be confusion. In some cases, they may not have any symptoms until the infection has worsened. How is this diagnosed? This condition is diagnosed based on your medical history and  a physical exam. You may also have other tests, including:  Urine tests.  Blood tests.  Tests for sexually transmitted infections (STIs). If you have had more than one UTI, a cystoscopy or imaging studies may be done to determine the cause of the infections. How is this treated? Treatment for this condition includes:  Antibiotic medicine.  Over-the-counter medicines to treat discomfort.  Drinking enough water to stay hydrated. If you have frequent infections or have other conditions such as a kidney stone, you may need to see a health care provider who specializes in the urinary tract (urologist). In rare cases, urinary tract infections can cause sepsis. Sepsis is a life-threatening condition that occurs when the body responds to an infection. Sepsis is treated in the hospital with IV antibiotics, fluids, and other medicines. Follow these instructions at home:  Medicines  Take over-the-counter and prescription medicines only as told by your health care provider.  If you were prescribed an antibiotic medicine, take it as told by your health care provider. Do not stop using the antibiotic even if you start to feel better. General instructions  Make sure you: ? Empty your bladder often and completely. Do not hold urine for long periods of time. ? Empty your bladder after sex. ? Wipe from front to back after a bowel movement if you are female. Use each tissue one time when you wipe.  Drink enough fluid to keep your urine  pale yellow.  Keep all follow-up visits as told by your health care provider. This is important. Contact a health care provider if:  Your symptoms do not get better after 1-2 days.  Your symptoms go away and then return. Get help right away if you have:  Severe pain in your back or your lower abdomen.  A fever.  Nausea or vomiting. Summary  A urinary tract infection (UTI) is an infection of any part of the urinary tract, which includes the kidneys, ureters, bladder, and urethra.  Most urinary tract infections are caused by bacteria in your genital area, around the entrance to your urinary tract (urethra).  Treatment for this condition often includes antibiotic medicines.  If you were prescribed an antibiotic medicine, take it as told by your health care provider. Do not stop using the antibiotic even if you start to feel better.  Keep all follow-up visits as told by your health care provider. This is important. This information is not intended to replace advice given to you by your health care provider. Make sure you discuss any questions you have with your health care provider. Document Released: 04/30/2005 Document Revised: 01/28/2018 Document Reviewed: 01/28/2018 Elsevier Interactive Patient Education  2019 [place] K. Shamal Stracener M.D.

## 2018-09-16 LAB — URINE CULTURE
MICRO NUMBER:: 180114
SPECIMEN QUALITY:: ADEQUATE

## 2018-09-17 NOTE — Progress Notes (Signed)
Tell patient that urine culture shows bacteria sensitive to medication given . Should resolve with current treatment .FU if not better. °

## 2018-10-26 DIAGNOSIS — F331 Major depressive disorder, recurrent, moderate: Secondary | ICD-10-CM | POA: Diagnosis not present

## 2018-10-26 DIAGNOSIS — F411 Generalized anxiety disorder: Secondary | ICD-10-CM | POA: Diagnosis not present

## 2018-12-01 ENCOUNTER — Other Ambulatory Visit: Payer: Self-pay | Admitting: Family Medicine

## 2018-12-12 DIAGNOSIS — N390 Urinary tract infection, site not specified: Secondary | ICD-10-CM | POA: Diagnosis not present

## 2018-12-12 DIAGNOSIS — R319 Hematuria, unspecified: Secondary | ICD-10-CM | POA: Diagnosis not present

## 2019-01-31 DIAGNOSIS — L649 Androgenic alopecia, unspecified: Secondary | ICD-10-CM | POA: Diagnosis not present

## 2019-01-31 DIAGNOSIS — L821 Other seborrheic keratosis: Secondary | ICD-10-CM | POA: Diagnosis not present

## 2019-02-28 DIAGNOSIS — H524 Presbyopia: Secondary | ICD-10-CM | POA: Diagnosis not present

## 2019-05-03 ENCOUNTER — Ambulatory Visit (INDEPENDENT_AMBULATORY_CARE_PROVIDER_SITE_OTHER): Payer: Medicare Other | Admitting: Adult Health

## 2019-05-03 ENCOUNTER — Encounter: Payer: Self-pay | Admitting: Adult Health

## 2019-05-03 ENCOUNTER — Other Ambulatory Visit: Payer: Self-pay

## 2019-05-03 ENCOUNTER — Encounter (INDEPENDENT_AMBULATORY_CARE_PROVIDER_SITE_OTHER): Payer: Self-pay

## 2019-05-03 DIAGNOSIS — F411 Generalized anxiety disorder: Secondary | ICD-10-CM

## 2019-05-03 DIAGNOSIS — F331 Major depressive disorder, recurrent, moderate: Secondary | ICD-10-CM | POA: Diagnosis not present

## 2019-05-03 DIAGNOSIS — G4700 Insomnia, unspecified: Secondary | ICD-10-CM | POA: Diagnosis not present

## 2019-05-03 MED ORDER — ESCITALOPRAM OXALATE 20 MG PO TABS: 20.0000 mg | ORAL_TABLET | Freq: Every day | ORAL | 1 refills | Status: DC

## 2019-05-03 MED ORDER — HYDROXYZINE HCL 25 MG PO TABS: ORAL_TABLET | ORAL | 5 refills | Status: DC

## 2019-05-03 MED ORDER — ARIPIPRAZOLE 2 MG PO TABS: 2.0000 mg | ORAL_TABLET | Freq: Every day | ORAL | 1 refills | Status: DC

## 2019-05-03 NOTE — Progress Notes (Signed)
[name] [id number] 02-28-1951 68 y.o.  Subjective:   Patient ID:  [name] is a 68 y.o. (DOB 22-May-1951) female.  Chief Complaint:  Chief Complaint  Patient presents with  . Anxiety  . Depression  . Sleeping Problem    HPI [name] presents to the office today for follow-up of anxiety, depression, insomnia.  Describes mood today as "ok". Pleasant. Mood symptoms - reports some depression, anxiety, and irritability "at times". Stating "I can tell that I'm much calmer than I used to be". Still grieves loss of mother. Gets "sad" some days. Feels like Abilify has been very "helpful". Stating "it has helped me cope with the loss and the change". Stable interest and motivation. Taking medications as prescribed.  Energy levels stable. Active, does not have a regular exercise routine. Had been doing yoga and hopes to restart in October. Works part-time - 2 days a week. Enjoys some usual interests and activities. Spending time with family - husband. Married 62 years - husband 72 years older than her - "I worry about him". Son and wife in [place] - son in Pharmacy school.  Appetite adequate. Weight stable - 120 pounds. Sleeps well most nights. Averages 8 hours.Sometimes has trouble getting to sleep.  Focus and concentration stable. Completing tasks. Managing aspects of household. Crafting. Denies SI or HI. Denies AH or VH.  Review of Systems:  Review of Systems  Musculoskeletal: Negative for gait problem.  Neurological: Negative for tremors.  Psychiatric/Behavioral:       Please refer to HPI    Medications: I have reviewed the patient's current medications.  Current Outpatient Medications  Medication Sig Dispense Refill  . ARIPIPRAZOLE, SENSOR, PO Take 0.25 mg by mouth daily.    Marland Kitchen atorvastatin (LIPITOR) 10 MG tablet TAKE 1 TABLET BY MOUTH EVERY DAY 90 tablet 3  . Calcium Citrate-Vitamin D (CALCIUM CITRATE + D PO) Take by mouth.    . escitalopram (LEXAPRO) 20 MG tablet Take  20 mg by mouth daily.     . LUTEIN PO Take by mouth.    . risperiDONE (RISPERDAL) 0.25 MG tablet Take 0.25 mg by mouth at bedtime.     No current facility-administered medications for this visit.     Medication Side Effects: None  Allergies: No Known Allergies  Past Medical History:  Diagnosis Date  . Depression   . Hyperlipidemia   . Sciatica     History reviewed. No pertinent family history.  Social History   Socioeconomic History  . Marital status: Married    Spouse name: Not on file  . Number of children: Not on file  . Years of education: Not on file  . Highest education level: Not on file  Occupational History  . Not on file  Social Needs  . Financial resource strain: Not on file  . Food insecurity    Worry: Not on file    Inability: Not on file  . Transportation needs    Medical: Not on file    Non-medical: Not on file  Tobacco Use  . Smoking status: Current Every Day Smoker    Types: Cigarettes  . Smokeless tobacco: Never Used  Substance and Sexual Activity  . Alcohol use: No  . Drug use: No  . Sexual activity: Not on file  Lifestyle  . Physical activity    Days per week: Not on file    Minutes per session: Not on file  . Stress: Not on file  Relationships  . Social connections  Talks on phone: Not on file    Gets together: Not on file    Attends religious service: Not on file    Active member of club or organization: Not on file    Attends meetings of clubs or organizations: Not on file    Relationship status: Not on file  . Intimate partner violence    Fear of current or ex partner: Not on file    Emotionally abused: Not on file    Physically abused: Not on file    Forced sexual activity: Not on file  Other Topics Concern  . Not on file  Social History Narrative  . Not on file    Past Medical History, Surgical history, Social history, and Family history were reviewed and updated as appropriate.   Please see review of systems for  further details on the patient's review from today.   Objective:   Physical Exam:  There were no vitals taken for this visit.  Physical Exam Constitutional:      General: She is not in acute distress.    Appearance: She is well-developed.  Musculoskeletal:        General: No deformity.  Neurological:     Mental Status: She is alert and oriented to person, place, and time.     Coordination: Coordination normal.  Psychiatric:        Attention and Perception: Attention and perception normal. She does not perceive auditory or visual hallucinations.        Mood and Affect: Mood normal. Mood is not anxious or depressed. Affect is not labile, blunt, angry or inappropriate.        Speech: Speech normal.        Behavior: Behavior normal.        Thought Content: Thought content normal. Thought content is not paranoid or delusional. Thought content does not include homicidal or suicidal ideation. Thought content does not include homicidal or suicidal plan.        Cognition and Memory: Cognition and memory normal.        Judgment: Judgment normal.     Comments: Insight intact     Lab Review:     Component Value Date/Time   NA 140 05/27/2018 0851   K 3.9 05/27/2018 0851   CL 102 05/27/2018 0851   CO2 32 05/27/2018 0851   GLUCOSE 94 05/27/2018 0851   BUN 13 05/27/2018 0851   CREATININE 0.78 05/27/2018 0851   CALCIUM 9.5 05/27/2018 0851   PROT 7.0 05/26/2017 1404   ALBUMIN 4.4 05/26/2017 1404   AST 22 05/26/2017 1404   ALT 13 05/26/2017 1404   ALKPHOS 69 05/26/2017 1404   BILITOT 0.6 05/26/2017 1404       Component Value Date/Time   WBC 9.3 05/27/2018 0851   RBC 4.59 05/27/2018 0851   HGB 14.1 05/27/2018 0851   HCT 42.2 05/27/2018 0851   PLT 313.0 05/27/2018 0851   MCV 91.8 05/27/2018 0851   MCHC 33.4 05/27/2018 0851   RDW 14.0 05/27/2018 0851   LYMPHSABS 1.6 05/27/2018 0851   MONOABS 0.7 05/27/2018 0851   EOSABS 0.1 05/27/2018 0851   BASOSABS 0.0 05/27/2018 0851    No  results found for: POCLITH, LITHIUM   No results found for: PHENYTOIN, PHENOBARB, VALPROATE, CBMZ   .res Assessment: Plan:     Plan:  1. Abilify 2mg  daily 2. Hydroxyzine 25 - 4 daily prn anxiety/insomnia 3. Lexapro 20mg  daily   RTC 6 months  Patient advised to contact  office with any questions, adverse effects, or acute worsening in signs and symptoms.  Discussed potential metabolic side effects associated with atypical antipsychotics, as well as potential risk for movement side effects. Advised pt to contact office if movement side effects occur.   [name] was seen today for anxiety, depression and sleeping problem.  Diagnoses and all orders for this visit:  Insomnia, unspecified type  Generalized anxiety disorder  Major depressive disorder, recurrent episode, moderate ([place])     Please see After Visit Summary for patient specific instructions.  No future appointments.  No orders of the defined types were placed in this encounter.   -------------------------------

## 2019-06-25 DIAGNOSIS — Z20828 Contact with and (suspected) exposure to other viral communicable diseases: Secondary | ICD-10-CM | POA: Diagnosis not present

## 2019-07-05 ENCOUNTER — Other Ambulatory Visit: Payer: Self-pay | Admitting: Family Medicine

## 2019-07-05 DIAGNOSIS — Z1231 Encounter for screening mammogram for malignant neoplasm of breast: Secondary | ICD-10-CM

## 2019-08-29 LAB — FECAL OCCULT BLOOD, IMMUNOCHEMICAL: IFOBT: POSITIVE

## 2019-09-02 ENCOUNTER — Telehealth (INDEPENDENT_AMBULATORY_CARE_PROVIDER_SITE_OTHER): Payer: Medicare Other | Admitting: Family Medicine

## 2019-09-02 DIAGNOSIS — Z1211 Encounter for screening for malignant neoplasm of colon: Secondary | ICD-10-CM | POA: Diagnosis not present

## 2019-09-02 DIAGNOSIS — R195 Other fecal abnormalities: Secondary | ICD-10-CM

## 2019-09-02 NOTE — Progress Notes (Signed)
Virtual Visit via Telephone Note  I connected with [name] on 09/02/19 at 10:00 AM EST by telephone and verified that I am speaking with the correct person using two identifiers.   I discussed the limitations, risks, security and privacy concerns of performing an evaluation and management service by telephone and the availability of in person appointments. I also discussed with the patient that there may be a patient responsible charge related to this service. The patient expressed understanding and agreed to proceed.  Location patient: home Location provider: work or home office Participants present for the call: patient, provider Patient did not have a visit in the prior 7 days to address this/these issue(s).   History of Present Illness: Pt is a 69 yo female with pmh sig for HLD, tobacco use, anxiety who is seen for ongoing concern.  Pt states she was sent a stool card by her insurance [employer].  The sample came back positive for blood.  Pt denies hematochezia, BRBPR, hemorrhoids, constipation, diarrhea, abdominal pain, fatigue, fever, chills, n/v.  Given the results, pt now ready to have a colonoscopy as she has been putting it off for yrs. Pt's mom had h/o UC.  She denies family h/o colon cancer.   Observations/Objective: Patient sounds cheerful and well on the phone. I do not appreciate any SOB. Speech and thought processing are grossly intact. Patient reported vitals:  Assessment and Plan: Heme positive stool  -noted on mail in stool cards.  No overt bleeding -discussed need for colonoscopy.  Pt agrees. -Plan: Ambulatory referral to Gastroenterology  Colon cancer screening  - Plan: Ambulatory referral to Gastroenterology   Follow Up Instructions: F/u prn  I did not refer this patient for an OV in the next 24 hours for this/these issue(s).  I discussed the assessment and treatment plan with the patient. The patient was provided an opportunity to ask questions and all were  answered. The patient agreed with the plan and demonstrated an understanding of the instructions.   The patient was advised to call back or seek an in-person evaluation if the symptoms worsen or if the condition fails to improve as anticipated.  I provided 5:29 minutes of non-face-to-face time during this encounter.   Billie Ruddy, MD

## 2019-09-05 ENCOUNTER — Encounter: Payer: Self-pay | Admitting: Gastroenterology

## 2019-09-07 ENCOUNTER — Encounter: Payer: Self-pay | Admitting: Family Medicine

## 2019-09-20 DIAGNOSIS — Z0120 Encounter for dental examination and cleaning without abnormal findings: Secondary | ICD-10-CM | POA: Diagnosis not present

## 2019-09-28 ENCOUNTER — Encounter: Payer: Self-pay | Admitting: Gastroenterology

## 2019-09-29 ENCOUNTER — Ambulatory Visit: Payer: Medicare Other | Admitting: Gastroenterology

## 2019-10-13 ENCOUNTER — Other Ambulatory Visit: Payer: Self-pay

## 2019-10-13 ENCOUNTER — Ambulatory Visit (AMBULATORY_SURGERY_CENTER): Payer: Self-pay

## 2019-10-13 VITALS — Temp 96.6°F | Ht 64.25 in | Wt 136.0 lb

## 2019-10-13 DIAGNOSIS — R195 Other fecal abnormalities: Secondary | ICD-10-CM

## 2019-10-13 NOTE — Progress Notes (Signed)
No allergies to soy or egg Pt is not on blood thinners or diet pills Denies issues with sedation/intubation Denies atrial flutter/fib Denies constipation   Emmi instructions given to pt  Pt is aware of Covid safety and care partner requirements.  

## 2019-10-19 ENCOUNTER — Other Ambulatory Visit: Payer: Self-pay

## 2019-10-20 ENCOUNTER — Ambulatory Visit (INDEPENDENT_AMBULATORY_CARE_PROVIDER_SITE_OTHER): Payer: Medicare Other | Admitting: Family Medicine

## 2019-10-20 ENCOUNTER — Encounter: Payer: Self-pay | Admitting: Family Medicine

## 2019-10-20 VITALS — BP 120/80 | HR 60 | Temp 97.5°F | Wt 134.0 lb

## 2019-10-20 DIAGNOSIS — Z780 Asymptomatic menopausal state: Secondary | ICD-10-CM

## 2019-10-20 DIAGNOSIS — Z23 Encounter for immunization: Secondary | ICD-10-CM

## 2019-10-20 DIAGNOSIS — Z0000 Encounter for general adult medical examination without abnormal findings: Secondary | ICD-10-CM

## 2019-10-20 DIAGNOSIS — E782 Mixed hyperlipidemia: Secondary | ICD-10-CM | POA: Diagnosis not present

## 2019-10-20 DIAGNOSIS — F17209 Nicotine dependence, unspecified, with unspecified nicotine-induced disorders: Secondary | ICD-10-CM | POA: Diagnosis not present

## 2019-10-20 DIAGNOSIS — Z1159 Encounter for screening for other viral diseases: Secondary | ICD-10-CM | POA: Diagnosis not present

## 2019-10-20 DIAGNOSIS — F331 Major depressive disorder, recurrent, moderate: Secondary | ICD-10-CM

## 2019-10-20 LAB — LIPID PANEL
Cholesterol: 160 mg/dL (ref 0–200)
HDL: 61.2 mg/dL (ref >39.00)
LDL Cholesterol: 83 mg/dL (ref 0–99)
NonHDL: 98.46
Total CHOL/HDL Ratio: 3
Triglycerides: 78 mg/dL (ref 0.0–149.0)
VLDL: 15.6 mg/dL (ref 0.0–40.0)

## 2019-10-20 LAB — CBC WITH DIFFERENTIAL/PLATELET
Basophils Absolute: 0.1 10*3/uL (ref 0.0–0.1)
Basophils Relative: 0.7 % (ref 0.0–3.0)
Eosinophils Absolute: 0.2 10*3/uL (ref 0.0–0.7)
Eosinophils Relative: 2.3 % (ref 0.0–5.0)
HCT: 40.3 % (ref 36.0–46.0)
Hemoglobin: 13.4 g/dL (ref 12.0–15.0)
Lymphocytes Relative: 25.9 % (ref 12.0–46.0)
Lymphs Abs: 2.3 10*3/uL (ref 0.7–4.0)
MCHC: 33.3 g/dL (ref 30.0–36.0)
MCV: 90.4 fl (ref 78.0–100.0)
Monocytes Absolute: 0.7 10*3/uL (ref 0.1–1.0)
Monocytes Relative: 8.1 % (ref 3.0–12.0)
Neutro Abs: 5.6 10*3/uL (ref 1.4–7.7)
Neutrophils Relative %: 63 % (ref 43.0–77.0)
Platelets: 311 10*3/uL (ref 150.0–400.0)
RBC: 4.46 Mil/uL (ref 3.87–5.11)
RDW: 14.3 % (ref 11.5–15.5)
WBC: 8.9 10*3/uL (ref 4.0–10.5)

## 2019-10-20 LAB — BASIC METABOLIC PANEL
BUN: 10 mg/dL (ref 6–23)
CO2: 31 mEq/L (ref 19–32)
Calcium: 9.2 mg/dL (ref 8.4–10.5)
Chloride: 103 mEq/L (ref 96–112)
Creatinine, Ser: 0.75 mg/dL (ref 0.40–1.20)
GFR: 76.95 mL/min (ref >60.00)
Glucose, Bld: 86 mg/dL (ref 70–99)
Potassium: 4.3 mEq/L (ref 3.5–5.1)
Sodium: 137 mEq/L (ref 135–145)

## 2019-10-20 LAB — HEMOGLOBIN A1C: Hgb A1c MFr Bld: 5.6 % (ref 4.6–6.5)

## 2019-10-20 MED ORDER — ATORVASTATIN CALCIUM 10 MG PO TABS: 10.0000 mg | ORAL_TABLET | Freq: Every day | ORAL | 3 refills | Status: DC

## 2019-10-20 NOTE — Patient Instructions (Addendum)
High Cholesterol  High cholesterol is a condition in which the blood has high levels of a white, waxy, fat-like substance (cholesterol). The human body needs small amounts of cholesterol. The liver makes all the cholesterol that the body needs. Extra (excess) cholesterol comes from the food that we eat. Cholesterol is carried from the liver by the blood through the blood vessels. If you have high cholesterol, deposits (plaques) may build up on the walls of your blood vessels (arteries). Plaques make the arteries narrower and stiffer. Cholesterol plaques increase your risk for heart attack and stroke. Work with your health care provider to keep your cholesterol levels in a healthy range. What increases the risk? This condition is more likely to develop in people who:  Eat foods that are high in animal fat (saturated fat) or cholesterol.  Are overweight.  Are not getting enough exercise.  Have a family history of high cholesterol. What are the signs or symptoms? There are no symptoms of this condition. How is this diagnosed? This condition may be diagnosed from the results of a blood test.  If you are older than age 46, your health care provider may check your cholesterol every 4-6 years.  You may be checked more often if you already have high cholesterol or other risk factors for heart disease. The blood test for cholesterol measures:  "Bad" cholesterol (LDL cholesterol). This is the main type of cholesterol that causes heart disease. The desired level for LDL is less than 100.  "Good" cholesterol (HDL cholesterol). This type helps to protect against heart disease by cleaning the arteries and carrying the LDL away. The desired level for HDL is 60 or higher.  Triglycerides. These are fats that the body can store or burn for energy. The desired number for triglycerides is lower than 150.  Total cholesterol. This is a measure of the total amount of cholesterol in your blood, including LDL  cholesterol, HDL cholesterol, and triglycerides. A healthy number is less than 200. How is this treated? This condition is treated with diet changes, lifestyle changes, and medicines. Diet changes  This may include eating more whole grains, fruits, vegetables, nuts, and fish.  This may also include cutting back on red meat and foods that have a lot of added sugar. Lifestyle changes  Changes may include getting at least 40 minutes of aerobic exercise 3 times a week. Aerobic exercises include walking, biking, and swimming. Aerobic exercise along with a healthy diet can help you maintain a healthy weight.  Changes may also include quitting smoking. Medicines  Medicines are usually given if diet and lifestyle changes have failed to reduce your cholesterol to healthy levels.  Your health care provider may prescribe a statin medicine. Statin medicines have been shown to reduce cholesterol, which can reduce the risk of heart disease. Follow these instructions at home: Eating and drinking If told by your health care provider:  Eat chicken (without skin), fish, veal, shellfish, ground [place] breast, and round or loin cuts of red meat.  Do not eat fried foods or fatty meats, such as hot dogs and salami.  Eat plenty of fruits, such as apples.  Eat plenty of vegetables, such as broccoli, potatoes, and carrots.  Eat beans, peas, and lentils.  Eat grains such as barley, rice, couscous, and bulgur wheat.  Eat pasta without cream sauces.  Use skim or nonfat milk, and eat low-fat or nonfat yogurt and cheeses.  Do not eat or drink whole milk, cream, ice cream, egg yolks,  or hard cheeses.  Do not eat stick margarine or tub margarines that contain trans fats (also called partially hydrogenated oils).  Do not eat saturated tropical oils, such as coconut oil and palm oil.  Do not eat cakes, cookies, crackers, or other baked goods that contain trans fats.  General instructions  Exercise as  directed by your health care provider. Increase your activity level with activities such as gardening, walking, and taking the stairs.  Take over-the-counter and prescription medicines only as told by your health care provider.  Do not use any products that contain nicotine or tobacco, such as cigarettes and e-cigarettes. If you need help quitting, ask your health care provider.  Keep all follow-up visits as told by your health care provider. This is important. Contact a health care provider if:  You are struggling to maintain a healthy diet or weight.  You need help to start on an exercise program.  You need help to stop smoking. Get help right away if:  You have chest pain.  You have trouble breathing. This information is not intended to replace advice given to you by your health care provider. Make sure you discuss any questions you have with your health care provider. Document Revised: 07/24/2017 Document Reviewed: 01/19/2016 Elsevier Patient Education  [place] 65 Years and Older, Female Preventive care refers to lifestyle choices and visits with your health care provider that can promote health and wellness. This includes:  A yearly physical exam. This is also called an annual well check.  Regular dental and eye exams.  Immunizations.  Screening for certain conditions.  Healthy lifestyle choices, such as diet and exercise. What can I expect for my preventive care visit? Physical exam Your health care provider will check:  Height and weight. These may be used to calculate body mass index (BMI), which is a measurement that tells if you are at a healthy weight.  Heart rate and blood pressure.  Your skin for abnormal spots. Counseling Your health care provider may ask you questions about:  Alcohol, tobacco, and drug use.  Emotional well-being.  Home and relationship well-being.  Sexual activity.  Eating habits.  History of  falls.  Memory and ability to understand (cognition).  Work and work Statistician.  Pregnancy and menstrual history. What immunizations do I need?  Influenza (flu) vaccine  This is recommended every year. Tetanus, diphtheria, and pertussis (Tdap) vaccine  You may need a Td booster every 10 years. Varicella (chickenpox) vaccine  You may need this vaccine if you have not already been vaccinated. Zoster (shingles) vaccine  You may need this after age 53. Pneumococcal conjugate (PCV13) vaccine  One dose is recommended after age 23. Pneumococcal polysaccharide (PPSV23) vaccine  One dose is recommended after age 76. Measles, mumps, and rubella (MMR) vaccine  You may need at least one dose of MMR if you were born in 1957 or later. You may also need a second dose. Meningococcal conjugate (MenACWY) vaccine  You may need this if you have certain conditions. Hepatitis A vaccine  You may need this if you have certain conditions or if you travel or work in places where you may be exposed to hepatitis A. Hepatitis B vaccine  You may need this if you have certain conditions or if you travel or work in places where you may be exposed to hepatitis B. Haemophilus influenzae type b (Hib) vaccine  You may need this if you have certain conditions. You may receive vaccines  as individual doses or as more than one vaccine together in one shot (combination vaccines). Talk with your health care provider about the risks and benefits of combination vaccines. What tests do I need? Blood tests  Lipid and cholesterol levels. These may be checked every 5 years, or more frequently depending on your overall health.  Hepatitis C test.  Hepatitis B test. Screening  Lung cancer screening. You may have this screening every year starting at age 25 if you have a 30-pack-year history of smoking and currently smoke or have quit within the past 15 years.  Colorectal cancer screening. All adults should  have this screening starting at age 9 and continuing until age 73. Your health care provider may recommend screening at age 31 if you are at increased risk. You will have tests every 1-10 years, depending on your results and the type of screening test.  Diabetes screening. This is done by checking your blood sugar (glucose) after you have not eaten for a while (fasting). You may have this done every 1-3 years.  Mammogram. This may be done every 1-2 years. Talk with your health care provider about how often you should have regular mammograms.  BRCA-related cancer screening. This may be done if you have a family history of breast, ovarian, tubal, or peritoneal cancers. Other tests  Sexually transmitted disease (STD) testing.  Bone density scan. This is done to screen for osteoporosis. You may have this done starting at age 46. Follow these instructions at home: Eating and drinking  Eat a diet that includes fresh fruits and vegetables, whole grains, lean protein, and low-fat dairy products. Limit your intake of foods with high amounts of sugar, saturated fats, and salt.  Take vitamin and mineral supplements as recommended by your health care provider.  Do not drink alcohol if your health care provider tells you not to drink.  If you drink alcohol: ? Limit how much you have to 0-1 drink a day. ? Be aware of how much alcohol is in your drink. In the [place] one drink equals one 12 oz bottle of beer (355 mL), one 5 oz glass of wine (148 mL), or one 1 oz glass of hard liquor (44 mL). Lifestyle  Take daily care of your teeth and gums.  Stay active. Exercise for at least 30 minutes on 5 or more days each week.  Do not use any products that contain nicotine or tobacco, such as cigarettes, e-cigarettes, and chewing tobacco. If you need help quitting, ask your health care provider.  If you are sexually active, practice safe sex. Use a condom or other form of protection in order to prevent STIs  (sexually transmitted infections).  Talk with your health care provider about taking a low-dose aspirin or statin. What's next?  Go to your health care provider once a year for a well check visit.  Ask your health care provider how often you should have your eyes and teeth checked.  Stay up to date on all vaccines. This information is not intended to replace advice given to you by your health care provider. Make sure you discuss any questions you have with your health care provider. Document Revised: 07/15/2018 Document Reviewed: 07/15/2018 Elsevier Patient Education  2020 [employer].

## 2019-10-20 NOTE — Progress Notes (Signed)
Subjective:     [name] is a 69 y.o. female and is here for a comprehensive physical exam. The patient reports problems - nicotine use.  Pt smoking 15 cigs per day.  States knows she needs to quit but it is hard to do.  Pt followed by [facility] for anxiety.  Taking Abilify, hydroxyzine, and lexapro.  Has gained a few pounds.  Not walking much after a foot injury.  Doing better now.  Has colonoscopy scheduled and needs to schedule pap.  Social History   Socioeconomic History  . Marital status: Married    Spouse name: Not on file  . Number of children: Not on file  . Years of education: Not on file  . Highest education level: Not on file  Occupational History  . Not on file  Tobacco Use  . Smoking status: Current Every Day Smoker    Types: Cigarettes  . Smokeless tobacco: Never Used  Substance and Sexual Activity  . Alcohol use: No  . Drug use: No  . Sexual activity: Not on file  Other Topics Concern  . Not on file  Social History Narrative  . Not on file   Social Determinants of Health   Financial Resource Strain:   . Difficulty of Paying Living Expenses:   Food Insecurity:   . Worried About Charity fundraiser in the Last Year:   . Arboriculturist in the Last Year:   Transportation Needs:   . Film/video editor (Medical):   Marland Kitchen Lack of Transportation (Non-Medical):   Physical Activity:   . Days of Exercise per Week:   . Minutes of Exercise per Session:   Stress:   . Feeling of Stress :   Social Connections:   . Frequency of Communication with Friends and Family:   . Frequency of Social Gatherings with Friends and Family:   . Attends Religious Services:   . Active Member of Clubs or Organizations:   . Attends Archivist Meetings:   Marland Kitchen Marital Status:   Intimate Partner Violence:   . Fear of Current or Ex-Partner:   . Emotionally Abused:   Marland Kitchen Physically Abused:   . Sexually Abused:    Health Maintenance  Topic Date Due  . Hepatitis C Screening  Never  done  . TETANUS/TDAP  Never done  . MAMMOGRAM  Never done  . COLONOSCOPY  Never done  . DEXA SCAN  Never done  . INFLUENZA VACCINE  03/05/2019  . PNA vac Low Risk Adult  Completed    The following portions of the patient's history were reviewed and updated as appropriate: allergies, current medications, past family history, past medical history, past social history, past surgical history and problem list.  Review of Systems A comprehensive review of systems was negative.   Objective:      Blood pressure 120/80, pulse 60, temperature (!) 97.5 F (36.4 C), temperature source Temporal, weight 134 lb (60.8 kg), SpO2 97 %.  Gen. Pleasant, well developed, well-nourished, in NAD HEENT - [place]/AT, PERRL, no scleral icterus, no nasal drainage, pharynx without erythema or exudate.  TMs normal b/l. Neck: No JVD, no thyromegaly, no carotid bruits Lungs: no use of accessory muscles, CTAB, no wheezes, rales or rhonchi Cardiovascular: RRR, No r/g/m, no peripheral edema Abdomen: BS present, soft, nontender,nondistended, no hepatosplenomegaly Musculoskeletal: No deformities, moves all four extremities, no cyanosis or clubbing, normal tone Neuro:  A&Ox3, CN II-XII intact, normal gait Skin:  Warm, dry, intact, no lesions Psych:  normal affect, mood appropriate     Assessment:    Healthy female exam.      Plan:     Anticipatory guidance given including wearing seatbelts, smoke detectors in the home, increasing physical activity, increasing p.o. intake of water and vegetables. -will obtain labs -pt to contact OB/Gyn for pap -encouraged to schedule colonoscopy -Given handout -Next CPE in 1 year See After Visit Summary for Counseling Recommendations    Mixed hyperlipidemia -Lifestyle modifications encouraged - Plan: Lipid panel, atorvastatin (LIPITOR) 10 MG tablet  Asymptomatic menopausal state -  Plan: Hemoglobin A1c, DG Bone Density  Need for hepatitis C screening test  - Plan: Hep C  Antibody  Need for Tdap vaccination  - Plan: Tdap vaccine greater than or equal to 7yo IM  Tobacco use disorder, continue -Smoking cessation counseling greater than 3 minutes, less than 10 minutes -Patient encouraged to try cutting down -Discussed various medication options.  Patient is not ready to quit. -We will continue to order  Major depressive disorder, recurrent -Continue follow-up with psychiatry -Continue current meds Abilify, hydroxyzine, Lexapro  Follow-up as needed  Grier Mitts, MD

## 2019-10-21 ENCOUNTER — Encounter: Payer: Self-pay | Admitting: Family Medicine

## 2019-10-21 DIAGNOSIS — F17209 Nicotine dependence, unspecified, with unspecified nicotine-induced disorders: Secondary | ICD-10-CM | POA: Insufficient documentation

## 2019-10-21 DIAGNOSIS — F331 Major depressive disorder, recurrent, moderate: Secondary | ICD-10-CM | POA: Insufficient documentation

## 2019-10-21 LAB — HEPATITIS C ANTIBODY
Hepatitis C Ab: NONREACTIVE
SIGNAL TO CUT-OFF: 0.03 (ref <1.00)

## 2019-10-24 ENCOUNTER — Encounter: Payer: Self-pay | Admitting: Gastroenterology

## 2019-10-27 ENCOUNTER — Encounter: Payer: Self-pay | Admitting: Gastroenterology

## 2019-10-27 ENCOUNTER — Other Ambulatory Visit: Payer: Self-pay

## 2019-10-27 ENCOUNTER — Ambulatory Visit (AMBULATORY_SURGERY_CENTER): Payer: Medicare Other | Admitting: Gastroenterology

## 2019-10-27 VITALS — BP 129/72 | HR 61 | Temp 95.5°F | Resp 13 | Ht 64.0 in | Wt 136.0 lb

## 2019-10-27 DIAGNOSIS — D124 Benign neoplasm of descending colon: Secondary | ICD-10-CM

## 2019-10-27 DIAGNOSIS — R195 Other fecal abnormalities: Secondary | ICD-10-CM | POA: Diagnosis not present

## 2019-10-27 DIAGNOSIS — D123 Benign neoplasm of transverse colon: Secondary | ICD-10-CM

## 2019-10-27 DIAGNOSIS — D122 Benign neoplasm of ascending colon: Secondary | ICD-10-CM | POA: Diagnosis not present

## 2019-10-27 DIAGNOSIS — K640 First degree hemorrhoids: Secondary | ICD-10-CM | POA: Diagnosis not present

## 2019-10-27 MED ORDER — SODIUM CHLORIDE 0.9 % IV SOLN: 500.0000 mL | Freq: Once | INTRAVENOUS | Status: DC

## 2019-10-27 NOTE — Progress Notes (Signed)
PT taken to PACU. Monitors in place. VSS. Report given to RN. 

## 2019-10-27 NOTE — Patient Instructions (Signed)
YOU HAD AN ENDOSCOPIC PROCEDURE TODAY AT [facility]:   Refer to the procedure report that was given to you for any specific questions about what was found during the examination.  If the procedure report does not answer your questions, please call your gastroenterologist to clarify.  If you requested that your care partner not be given the details of your procedure findings, then the procedure report has been included in a sealed envelope for you to review at your convenience later.  YOU SHOULD EXPECT: Some feelings of bloating in the abdomen. Passage of more gas than usual.  Walking can help get rid of the air that was put into your GI tract during the procedure and reduce the bloating. If you had a lower endoscopy (such as a colonoscopy or flexible sigmoidoscopy) you may notice spotting of blood in your stool or on the toilet paper. If you underwent a bowel prep for your procedure, you may not have a normal bowel movement for a few days.  Please Note:  You might notice some irritation and congestion in your nose or some drainage.  This is from the oxygen used during your procedure.  There is no need for concern and it should clear up in a day or so.  SYMPTOMS TO REPORT IMMEDIATELY:   Following lower endoscopy (colonoscopy or flexible sigmoidoscopy):  Excessive amounts of blood in the stool  Significant tenderness or worsening of abdominal pains  Swelling of the abdomen that is new, acute  Fever of 100F or higher   For urgent or emergent issues, a gastroenterologist can be reached at any hour by calling [phone]. Do not use MyChart messaging for urgent concerns.    DIET:  We do recommend a small meal at first, but then you may proceed to your regular diet.  Drink plenty of fluids but you should avoid alcoholic beverages for 24 hours. Please follow a high fiber diet.  MEDICATIONS: Continue present medications. Use Fibercon 1 tablet by mouth daily.  Please see handouts  given to you by your recovery nurse.  ACTIVITY:  You should plan to take it easy for the rest of today and you should NOT DRIVE or use heavy machinery until tomorrow (because of the sedation medicines used during the test).    FOLLOW UP: Our staff will call the number listed on your records 48-72 hours following your procedure to check on you and address any questions or concerns that you may have regarding the information given to you following your procedure. If we do not reach you, we will leave a message.  We will attempt to reach you two times.  During this call, we will ask if you have developed any symptoms of COVID 19. If you develop any symptoms (ie: fever, flu-like symptoms, shortness of breath, cough etc.) before then, please call [phone].  If you test positive for Covid 19 in the 2 weeks post procedure, please call and report this information to [place].    If any biopsies were taken you will be contacted by phone or by letter within the next 1-3 weeks.  Please call us at [phone] if you have not heard about the biopsies in 3 weeks.   Thank you for allowing [place] to provide for your healthcare needs today.   SIGNATURES/CONFIDENTIALITY: You and/or your care partner have signed paperwork which will be entered into your electronic medical record.  These signatures attest to the fact that that the information above on your After  Visit Summary has been reviewed and is understood.  Full responsibility of the confidentiality of this discharge information lies with you and/or your care-partner. 

## 2019-10-27 NOTE — Op Note (Addendum)
[facility] Patient Name: [name] Procedure Date: 10/27/2019 8:04 AM MRN: [id number] Endoscopist: Justice Britain , MD Age: 69 Referring MD:  Date of Birth: 01-Dec-1950 Gender: Female Account #: [id number] Procedure:                Colonoscopy Indications:              Colon Cancer Screening (first colonoscopy),                            Gastrointestinal occult blood loss, FHx IBD (mother) Medicines:                Monitored Anesthesia Care Procedure:                Pre-Anesthesia Assessment:                           - Prior to the procedure, a History and Physical                            was performed, and patient medications and                            allergies were reviewed. The patient's tolerance of                            previous anesthesia was also reviewed. The risks                            and benefits of the procedure and the sedation                            options and risks were discussed with the patient.                            All questions were answered, and informed consent                            was obtained. Prior Anticoagulants: The patient has                            taken no previous anticoagulant or antiplatelet                            agents. ASA Grade Assessment: II - A patient with                            mild systemic disease. After reviewing the risks                            and benefits, the patient was deemed in                            satisfactory condition to undergo the procedure.  After obtaining informed consent, the colonoscope                            was passed under direct vision. Throughout the                            procedure, the patient's blood pressure, pulse, and                            oxygen saturations were monitored continuously. The                            Colonoscope was introduced through the anus and                            advanced to  the 4 cm into the ileum. The                            colonoscopy was performed without difficulty. The                            patient tolerated the procedure. The quality of the                            bowel preparation was adequate. The terminal ileum,                            ileocecal valve, appendiceal orifice, and rectum                            were photographed. Scope In: 8:08:55 AM Scope Out: 8:30:45 AM Scope Withdrawal Time: 0 hours 18 minutes 9 seconds  Total Procedure Duration: 0 hours 21 minutes 50 seconds  Findings:                 The digital rectal exam findings include                            hemorrhoids. Pertinent negatives include no                            palpable rectal lesions.                           The terminal ileum and ileocecal valve appeared                            normal.                           Four sessile polyps were found in the descending                            colon (1), transverse colon (2) and ascending colon                            (  1). The polyps were 4 to 11 mm in size. These                            polyps were removed with a cold snare. Resection                            and retrieval were complete.                           Normal mucosa was found in the entire colon                            otherwise.                           Non-bleeding non-thrombosed external and internal                            hemorrhoids were found during retroflexion, during                            perianal exam and during digital exam. The                            hemorrhoids were Grade I (internal hemorrhoids that                            do not prolapse). Complications:            No immediate complications. Estimated Blood Loss:     Estimated blood loss was minimal. Impression:               - Hemorrhoids found on digital rectal exam.                           - The examined portion of the ileum was normal.                            - Four 4 to 11 mm polyps in the descending colon,                            in the transverse colon and in the ascending colon,                            removed with a cold snare. Resected and retrieved.                           - Normal mucosa in the entire examined colon                            otherwise.                           - Non-bleeding non-thrombosed external and internal  hemorrhoids. Recommendation:           - The patient will be observed post-procedure,                            until all discharge criteria are met.                           - Discharge patient to home.                           - Patient has a contact number available for                            emergencies. The signs and symptoms of potential                            delayed complications were discussed with the                            patient. Return to normal activities tomorrow.                            Written discharge instructions were provided to the                            patient.                           - High fiber diet.                           - Use FiberCon 1 tablet PO daily.                           - Continue present medications.                           - Await pathology results.                           - Repeat colonoscopy in 3 years for surveillance.                           - If in future patient has evidence of anemia                            and/or Iron deficiency consider further                           - The findings and recommendations were discussed                            with the patient. Justice Britain, MD 10/27/2019 8:37:36 AM

## 2019-10-31 ENCOUNTER — Telehealth: Payer: Self-pay | Admitting: *Deleted

## 2019-10-31 ENCOUNTER — Telehealth: Payer: Self-pay

## 2019-10-31 ENCOUNTER — Encounter: Payer: Self-pay | Admitting: Adult Health

## 2019-10-31 ENCOUNTER — Ambulatory Visit (INDEPENDENT_AMBULATORY_CARE_PROVIDER_SITE_OTHER): Payer: Medicare Other | Admitting: Adult Health

## 2019-10-31 ENCOUNTER — Other Ambulatory Visit: Payer: Self-pay

## 2019-10-31 DIAGNOSIS — F411 Generalized anxiety disorder: Secondary | ICD-10-CM

## 2019-10-31 DIAGNOSIS — G4700 Insomnia, unspecified: Secondary | ICD-10-CM | POA: Diagnosis not present

## 2019-10-31 DIAGNOSIS — F331 Major depressive disorder, recurrent, moderate: Secondary | ICD-10-CM

## 2019-10-31 MED ORDER — ARIPIPRAZOLE 2 MG PO TABS: 2.0000 mg | ORAL_TABLET | Freq: Every day | ORAL | 3 refills | Status: DC

## 2019-10-31 MED ORDER — ESCITALOPRAM OXALATE 20 MG PO TABS: 20.0000 mg | ORAL_TABLET | Freq: Every day | ORAL | 3 refills | Status: DC

## 2019-10-31 MED ORDER — HYDROXYZINE HCL 25 MG PO TABS: ORAL_TABLET | ORAL | 5 refills | Status: DC

## 2019-10-31 NOTE — Telephone Encounter (Signed)
Left mess on f/u call 

## 2019-10-31 NOTE — Progress Notes (Signed)
D[place] Bokhari [id number] 12-Sep-1950 69 y.o.  Subjective:   Patient ID:  [name] is a 69 y.o. (DOB 11/21/1950) female.  Chief Complaint: No chief complaint on file.   HPI Ahnylah Glidewell presents to the office today for follow-up of anxiety, depression, insomnia.  Describes mood today as "alright". Pleasant. Mood symptoms - reports some depression, anxiety, and irritability "at times". Stating "I've been doing pretty good". She and husband doing well. {lans to visit sons over the summer. Stable interest and motivation. Taking medications as prescribed.  Energy levels stable. Active, does not have a regular exercise routine. Works part-time - 2 days a week - Lawyer. Enjoys some usual interests and activities. Married. Lives with husband of 51 years. 2 sons - one in [place] and other son and wife in [place] - son in Pharmacy school.  Appetite adequate. Weight stable - 130 pounds. Sleeps well most nights. Averages 8 to 9 hours. Focus and concentration stable. Completing tasks. Managing aspects of household. Crocheting a quilt for grand baby due in May.  Denies SI or HI. Denies AH or VH.   PHQ2-9     Office Visit from 05/26/2017 in [place] [facility] Total Score  2      Review of Systems:  Review of Systems  Musculoskeletal: Negative for gait problem.  Neurological: Negative for tremors.  Psychiatric/Behavioral:       Please refer to HPI    Medications: I have reviewed the patient's current medications.  Current Outpatient Medications  Medication Sig Dispense Refill  . ARIPiprazole (ABILIFY) 2 MG tablet Take 1 tablet (2 mg total) by mouth daily. 90 tablet 1  . ARIPIPRAZOLE, SENSOR, PO Take 0.25 mg by mouth daily.    Marland Kitchen atorvastatin (LIPITOR) 10 MG tablet Take 1 tablet (10 mg total) by mouth daily. 90 tablet 3  . Calcium Citrate-Vitamin D (CALCIUM CITRATE + D PO) Take by mouth.    . escitalopram (LEXAPRO) 20 MG tablet Take 1 tablet (20 mg  total) by mouth daily. 90 tablet 1  . hydrOXYzine (ATARAX/VISTARIL) 25 MG tablet Take one capsule up to four times a day for anxiety/sleep. 120 tablet 5  . LUTEIN PO Take by mouth.     Current Facility-Administered Medications  Medication Dose Route Frequency Provider Last Rate Last Admin  . 0.9 %  sodium chloride infusion  500 mL Intravenous Once Mansouraty, Telford Nab., MD        Medication Side Effects: None  Allergies: No Known Allergies  Past Medical History:  Diagnosis Date  . Depression   . Hyperlipidemia   . Sciatica   . Tobacco use disorder, continuous     Family History  Problem Relation Age of Onset  . Colon cancer Neg Hx   . Colon polyps Neg Hx   . Esophageal cancer Neg Hx   . Stomach cancer Neg Hx   . Rectal cancer Neg Hx     Social History   Socioeconomic History  . Marital status: Married    Spouse name: Not on file  . Number of children: Not on file  . Years of education: Not on file  . Highest education level: Not on file  Occupational History  . Not on file  Tobacco Use  . Smoking status: Current Every Day Smoker    Types: Cigarettes  . Smokeless tobacco: Never Used  Substance and Sexual Activity  . Alcohol use: No  . Drug use: No  . Sexual activity: Not on file  Other Topics Concern  . Not on file  Social History Narrative  . Not on file   Social Determinants of Health   Financial Resource Strain:   . Difficulty of Paying Living Expenses:   Food Insecurity:   . Worried About Charity fundraiser in the Last Year:   . Arboriculturist in the Last Year:   Transportation Needs:   . Film/video editor (Medical):   Marland Kitchen Lack of Transportation (Non-Medical):   Physical Activity:   . Days of Exercise per Week:   . Minutes of Exercise per Session:   Stress:   . Feeling of Stress :   Social Connections:   . Frequency of Communication with Friends and Family:   . Frequency of Social Gatherings with Friends and Family:   . Attends  Religious Services:   . Active Member of Clubs or Organizations:   . Attends Archivist Meetings:   Marland Kitchen Marital Status:   Intimate Partner Violence:   . Fear of Current or Ex-Partner:   . Emotionally Abused:   Marland Kitchen Physically Abused:   . Sexually Abused:     Past Medical History, Surgical history, Social history, and Family history were reviewed and updated as appropriate.   Please see review of systems for further details on the patient's review from today.   Objective:   Physical Exam:  There were no vitals taken for this visit.  Physical Exam Constitutional:      General: She is not in acute distress. Musculoskeletal:        General: No deformity.  Neurological:     Mental Status: She is alert and oriented to person, place, and time.     Coordination: Coordination normal.  Psychiatric:        Attention and Perception: Attention and perception normal. She does not perceive auditory or visual hallucinations.        Mood and Affect: Mood normal. Mood is not anxious or depressed. Affect is not labile, blunt, angry or inappropriate.        Speech: Speech normal.        Behavior: Behavior normal.        Thought Content: Thought content normal. Thought content is not paranoid or delusional. Thought content does not include homicidal or suicidal ideation. Thought content does not include homicidal or suicidal plan.        Cognition and Memory: Cognition and memory normal.        Judgment: Judgment normal.     Comments: Insight intact     Lab Review:     Component Value Date/Time   NA 137 10/20/2019 1031   K 4.3 10/20/2019 1031   CL 103 10/20/2019 1031   CO2 31 10/20/2019 1031   GLUCOSE 86 10/20/2019 1031   BUN 10 10/20/2019 1031   CREATININE 0.75 10/20/2019 1031   CALCIUM 9.2 10/20/2019 1031   PROT 7.0 05/26/2017 1404   ALBUMIN 4.4 05/26/2017 1404   AST 22 05/26/2017 1404   ALT 13 05/26/2017 1404   ALKPHOS 69 05/26/2017 1404   BILITOT 0.6 05/26/2017 1404        Component Value Date/Time   WBC 8.9 10/20/2019 1031   RBC 4.46 10/20/2019 1031   HGB 13.4 10/20/2019 1031   HCT 40.3 10/20/2019 1031   PLT 311.0 10/20/2019 1031   MCV 90.4 10/20/2019 1031   MCHC 33.3 10/20/2019 1031   RDW 14.3 10/20/2019 1031   LYMPHSABS 2.3 10/20/2019 1031   MONOABS  0.7 10/20/2019 1031   EOSABS 0.2 10/20/2019 1031   BASOSABS 0.1 10/20/2019 1031    No results found for: POCLITH, LITHIUM   No results found for: PHENYTOIN, PHENOBARB, VALPROATE, CBMZ   .res Assessment: Plan:    Plan:  1. Abilify 2mg  daily 2. Hydroxyzine 25 - 4 daily prn anxiety/insomnia 3. Lexapro 20mg  daily   RTC 6 months  Patient advised to contact office with any questions, adverse effects, or acute worsening in signs and symptoms.  Discussed potential metabolic side effects associated with atypical antipsychotics, as well as potential risk for movement side effects. Advised pt to contact office if movement side effects occur.    There are no diagnoses linked to this encounter.   Please see After Visit Summary for patient specific instructions.  Future Appointments  Date Time Provider [place]  10/31/2019 11:00 AM Ishmel Acevedo, Berdie Ogren, NP [facility] None  11/03/2019  3:00 PM Billie Ruddy, MD [facility]    No orders of the defined types were placed in this encounter.   -------------------------------

## 2019-10-31 NOTE — Telephone Encounter (Signed)
NO ANSWER, MESSAGE LEFT FOR PATIENT. 

## 2019-10-31 NOTE — Telephone Encounter (Signed)
Second follow up  Call attempt.  Message left on answering machine.

## 2019-11-01 ENCOUNTER — Encounter: Payer: Self-pay | Admitting: Gastroenterology

## 2019-11-01 ENCOUNTER — Telehealth: Payer: Self-pay

## 2019-11-01 NOTE — Telephone Encounter (Signed)
Prior authorization submitted and approved for HYDROXYZINE 25 MG #120/month, effective 11/01/2019-10/31/2020 with [employer] Medicare Part D.   Submitted through cover my meds

## 2019-11-02 ENCOUNTER — Other Ambulatory Visit: Payer: Self-pay

## 2019-11-03 ENCOUNTER — Encounter: Payer: Self-pay | Admitting: Family Medicine

## 2019-11-03 ENCOUNTER — Other Ambulatory Visit (HOSPITAL_COMMUNITY)
Admission: RE | Admit: 2019-11-03 | Discharge: 2019-11-03 | Disposition: A | Discharge status: Home / Self Care | Payer: Medicare Other | Source: Ambulatory Visit | Attending: Family Medicine | Admitting: Family Medicine

## 2019-11-03 ENCOUNTER — Ambulatory Visit (INDEPENDENT_AMBULATORY_CARE_PROVIDER_SITE_OTHER): Payer: Medicare Other | Admitting: Family Medicine

## 2019-11-03 VITALS — BP 122/66 | HR 65 | Temp 97.6°F | Wt 136.0 lb

## 2019-11-03 DIAGNOSIS — Z124 Encounter for screening for malignant neoplasm of cervix: Secondary | ICD-10-CM

## 2019-11-03 DIAGNOSIS — Z780 Asymptomatic menopausal state: Secondary | ICD-10-CM | POA: Diagnosis not present

## 2019-11-03 DIAGNOSIS — Z1151 Encounter for screening for human papillomavirus (HPV): Secondary | ICD-10-CM | POA: Diagnosis not present

## 2019-11-03 NOTE — Patient Instructions (Signed)
Cancer Screening for Women A cancer screening is a test or exam that checks for cancer. Your health care provider will recommend specific cancer screenings based on your age, personal history, and family history of cancer. Work with your health care provider to create a cancer screening schedule that protects your health. Why is cancer screening done? Cancer screening is done to look for cancer in the very early stages, before it spreads and becomes harder to treat and before you would start to notice symptoms. Finding cancer early improves the chances of successful treatment. It may save your life. Who should be screened for cancer? All women should be screened for certain cancers, including breast cancer, cervical cancer, and skin cancer. Your health care provider may recommend screenings for other types of cancer if:  You had cancer before.  You have a family member with cancer.  You have abnormal genes that could increase the risk of cancer.  You have risk factors for certain cancers, such as smoking. When you should be screened for cancer depends on:  Your age.  Your medical history and your family's medical history.  Certain lifestyle factors, such as smoking.  Environmental exposure, such as to asbestos. What are some common cancer screenings? Breast cancer Breast cancer screening is done with a test that takes images of breast tissue (mammogram). Here are some screening guidelines:  When you are age 40-44, you will be given the choice to start having mammograms.  When you are age 45-54, you should have a mammogram every year.  You may start having mammograms before age 45 if you have risk factors for breast cancer, such as having an immediate family member with breast cancer.  When you are age 55 or older, you should have a mammogram every 1-2 years for as long as you are in good health and have a life expectancy of 10 years or more.  It is important to know what your  breasts look and feel like so you can report any changes to your health care provider.  Cervical cancer Cervical cancer screening is done with a Pap test. This testchecks for abnormalities, including the virus that causes cervical cancer (human papillomavirus, or HPV). To perform the test, a health care provider takes a swab of cervical cells during a pelvic exam. Screening for cervical cancer with a Pap test should start at age 21. Here are some screening guidelines:  When you are age 21-29, you should have a Pap test every 3 years.  When you are age 30-65, you should have a Pap test and HPV test every 5 years or have a Pap test every 3 years.  You may be screened for cervical cancer more often if you have risk factors for cervical cancer.  If your Pap tests are abnormal, you may have an HPV test.  If you have had the HPV vaccine, you will still be screened for cervical cancer and follow normal screening recommendations. You do not need to be screened for cervical cancer if any of the following apply to you:  You are older than age 65 and you have not had a serious cervical precancer or cancer in the last 20 years.  Your cervix and uterus have been removed and you have never had cervical cancer or precancerous cells. Endometrial cancer There is no standard screening test for endometrial cancer, but the cancer can be detected with:  A test of a sample of tissue taken from the lining of the uterus (endometrial tissue   biopsy).  A vaginal ultrasound.  Pap tests. If you are at increased risk for endometrial cancer, you may need to have these tests more often than normal. You are at increased risk if:  You have a family history of ovarian, uterine, or colon cancer.  You are taking tamoxifen, a drug that is used to treat breast cancer.  You have certain types of colon cancer. If you have reached menopause, it is especially important to talk with your health care provider about any vaginal  bleeding or spotting. Screening for endometrial cancer is not recommended for women who do not have symptoms of the cancer, such as vaginal bleeding. Colorectal cancer  All adults should have screening for colorectal cancer starting at age 50 and continuing until age 75. Your health care provider may recommend screening at age 45. You will have tests every 1-10 years, depending on your results and the type of screening test. If you have a family history of colon or rectal cancer or other risk factors, you may need to start having screenings earlier. Talk with your health care provider about which screening test is right for you and how often you should be screened. Colorectal cancer screening looks for cancer or for growths called polyps that often form before cancer starts. Tests to look for cancer or polyps include:  Colonoscopy or flexible sigmoidoscopy. For these procedures, a flexible tube with a small camera is inserted into the rectum.  CT colonography. This test uses X-rays and a contrast dye to check the colon for polyps. If a polyp is found, you may need to have a colonoscopy so the polyp can be located and removed. Tests to look for cancer in the stool (feces) include:  Guaiac-based fecal occult blood test (FOBT). This test detects blood in stool. It can be done at home with a kit.  Fecal immunochemical test (FIT). This test detects blood in stool. For this test, you will need to collect stool samples at home.  Stool DNA test. This test looks for blood in stool and any changes in DNA that can lead to colon cancer. For this test, you will need to collect a stool sample at home and send it to a lab.  Skin cancer Skin cancer screening is done by checking the skin for unusual moles or spots and any changes in existing moles. Your health care provider should check your skin for signs of skin cancer at every physical exam. You should check your skin every month and tell your health care  provider right away if anything looks unusual. Women with a higher-than-normal risk for skin cancer may want to see a skin specialist (dermatologist) for an annual body check. Lung cancer Lung cancer screening is done with a CT scan that looks for abnormal cells in the lungs. Discuss lung cancer screening with your health care provider if you are 55-74 years old and if any of the following apply to you:  You currently smoke.  You used to smoke heavily.  You have a smoking history of 1 pack a day for 30 years or 2 packs a day for 15 years.  You have quit smoking within the past 15 years. If you smoke heavily or if you used to smoke, you may need to be screened every year. Where to find more information  National Cancer Institute: https://www.cancer.gov/about-cancer/screening  Centers for Disease Control and Prevention: https://www.cdc.gov/cancer/dcpc/prevention/screening.htm  Department of Health and Human Services: https://www.womenshealth.gov/screening-tests-and-vaccines/screening-tests-for-women Contact a health care provider if:  You have   concerns about any signs or symptoms of cancer, such as: ? Moles that have an unusual shape or color. ? Changes in existing moles. ? A sore on your skin that does not heal. ? Blood in your stool. ? Fatigue that does not go away. ? Frequent pain or cramping in your abdomen. ? Coughing, or coughing up blood. ? Losing weight without trying. ? Lumps or other changes in your breasts. ? Vaginal bleeding, spotting, or changes in your periods. Summary  Be aware of and watch for signs and symptoms of cancer, especially symptoms of breast cancer, cervical cancer, endometrial cancer, colorectal cancer, skin cancer, and lung cancer.  Early detection of cancer with cancer screening may save your life.  Talk with your health care provider about your specific cancer risks.  Work together with your health care provider to create a cancer screening plan  that is right for you. This information is not intended to replace advice given to you by your health care provider. Make sure you discuss any questions you have with your health care provider. Document Revised: 11/10/2018 Document Reviewed: 04/17/2016 Elsevier Patient Education  2020 Elsevier Inc.  

## 2019-11-03 NOTE — Progress Notes (Signed)
Subjective:    Patient ID: [name], female    DOB: 10-27-1950, 69 y.o.   MRN: [id number]  Chief Complaint  Patient presents with  . Gynecologic Exam    Pt has no other concerns today     HPI Patient was seen today for pap.  Pt denies dysuria, vaginal d/c, or vaginal irritation.  Recently had colonoscopy.  States a few polyps were removed.  Past Medical History:  Diagnosis Date  . Depression   . Hyperlipidemia   . Sciatica   . Tobacco use disorder, continuous     No Known Allergies  ROS General: Denies fever, chills, night sweats, changes in weight, changes in appetite HEENT: Denies headaches, ear pain, changes in vision, rhinorrhea, sore throat CV: Denies CP, palpitations, SOB, orthopnea Pulm: Denies SOB, cough, wheezing GI: Denies abdominal pain, nausea, vomiting, diarrhea, constipation GU: Denies dysuria, hematuria, frequency, vaginal discharge Msk: Denies muscle cramps, joint pains Neuro: Denies weakness, numbness, tingling Skin: Denies rashes, bruising Psych: Denies depression, anxiety, hallucinations    Objective:    Blood pressure 122/66, pulse 65, temperature 97.6 F (36.4 C), temperature source Temporal, weight 136 lb (61.7 kg), SpO2 97 %.   Gen. Pleasant, well-nourished, in no distress, normal affect   HEENT: [place]/AT, face symmetric, conjunctiva clear, no scleral icterus, PERRLA, EOMI, nares patent without drainage Lungs: no accessory muscle use, CTAB, no wheezes or rales Cardiovascular: RRR, no peripheral edema Abdomen: BS present, soft, NT/ND [id number] external genitalia, hair absent, several warty appearing lesions noted on L labia majora and mons pubis.Normal anus and perineum.  Urethral meatus normal.  Urethra normal.  Bladder normal in size.  Vagina with normal rugae, no discharge or lesions.  No cystocele or rectocele noted.  Cervix normal in appearance without lesions or discharge.  Uterus normal in position, mobility, no tenderness.  Adnexa without  masses or tenderness.  Pap collected. Neuro:  A&Ox3, CN II-XII intact, normal gait Skin:  Warm, no lesions/ rash   Wt Readings from Last 3 Encounters:  11/03/19 136 lb (61.7 kg)  10/27/19 136 lb (61.7 kg)  10/20/19 134 lb (60.8 kg)    Lab Results  Component Value Date   WBC 8.9 10/20/2019   HGB 13.4 10/20/2019   HCT 40.3 10/20/2019   PLT 311.0 10/20/2019   GLUCOSE 86 10/20/2019   CHOL 160 10/20/2019   TRIG 78.0 10/20/2019   HDL 61.20 10/20/2019   LDLCALC 83 10/20/2019   ALT 13 05/26/2017   AST 22 05/26/2017   NA 137 10/20/2019   K 4.3 10/20/2019   CL 103 10/20/2019   CREATININE 0.75 10/20/2019   BUN 10 10/20/2019   CO2 31 10/20/2019   HGBA1C 5.6 10/20/2019    Assessment/Plan:  Cervical cancer screening  - Plan: PAP [[place]]  F/u prn  Grier Mitts, MD

## 2019-11-10 LAB — CYTOLOGY - PAP
Comment: NEGATIVE
Comment: NEGATIVE
Diagnosis: NEGATIVE
High risk HPV: NEGATIVE
Trichomonas: NEGATIVE

## 2020-01-31 DIAGNOSIS — B078 Other viral warts: Secondary | ICD-10-CM | POA: Diagnosis not present

## 2020-01-31 DIAGNOSIS — D485 Neoplasm of uncertain behavior of skin: Secondary | ICD-10-CM | POA: Diagnosis not present

## 2020-01-31 DIAGNOSIS — C4441 Basal cell carcinoma of skin of scalp and neck: Secondary | ICD-10-CM | POA: Diagnosis not present

## 2020-01-31 DIAGNOSIS — L821 Other seborrheic keratosis: Secondary | ICD-10-CM | POA: Diagnosis not present

## 2020-01-31 DIAGNOSIS — C44719 Basal cell carcinoma of skin of left lower limb, including hip: Secondary | ICD-10-CM | POA: Diagnosis not present

## 2020-02-27 DIAGNOSIS — Z85828 Personal history of other malignant neoplasm of skin: Secondary | ICD-10-CM | POA: Diagnosis not present

## 2020-02-27 DIAGNOSIS — B078 Other viral warts: Secondary | ICD-10-CM | POA: Diagnosis not present

## 2020-02-27 DIAGNOSIS — C44719 Basal cell carcinoma of skin of left lower limb, including hip: Secondary | ICD-10-CM | POA: Diagnosis not present

## 2020-03-01 DIAGNOSIS — H524 Presbyopia: Secondary | ICD-10-CM | POA: Diagnosis not present

## 2020-04-12 DIAGNOSIS — B070 Plantar wart: Secondary | ICD-10-CM | POA: Diagnosis not present

## 2020-04-12 DIAGNOSIS — Z85828 Personal history of other malignant neoplasm of skin: Secondary | ICD-10-CM | POA: Diagnosis not present

## 2020-04-24 ENCOUNTER — Telehealth: Payer: Self-pay | Admitting: Family Medicine

## 2020-04-24 NOTE — Telephone Encounter (Signed)
Left message for patient to schedule Annual Wellness Visit.  Please schedule with Nurse Health Advisor Shannon Crews, RN at [facility]  

## 2020-05-02 ENCOUNTER — Other Ambulatory Visit: Payer: Self-pay

## 2020-05-02 ENCOUNTER — Encounter: Payer: Self-pay | Admitting: Adult Health

## 2020-05-02 ENCOUNTER — Ambulatory Visit (INDEPENDENT_AMBULATORY_CARE_PROVIDER_SITE_OTHER): Payer: Medicare Other | Admitting: Adult Health

## 2020-05-02 DIAGNOSIS — G4700 Insomnia, unspecified: Secondary | ICD-10-CM | POA: Diagnosis not present

## 2020-05-02 DIAGNOSIS — F411 Generalized anxiety disorder: Secondary | ICD-10-CM

## 2020-05-02 DIAGNOSIS — F331 Major depressive disorder, recurrent, moderate: Secondary | ICD-10-CM | POA: Diagnosis not present

## 2020-05-02 MED ORDER — ARIPIPRAZOLE 2 MG PO TABS: 2.0000 mg | ORAL_TABLET | Freq: Every day | ORAL | 3 refills | Status: DC

## 2020-05-02 MED ORDER — HYDROXYZINE HCL 25 MG PO TABS: ORAL_TABLET | ORAL | 5 refills | Status: DC

## 2020-05-02 MED ORDER — ESCITALOPRAM OXALATE 20 MG PO TABS: 20.0000 mg | ORAL_TABLET | Freq: Every day | ORAL | 3 refills | Status: DC

## 2020-05-02 NOTE — Progress Notes (Signed)
[name] [id number] [birth year] 69 y.o.  Subjective:   Patient ID:  [name] is a 69 y.o. (DOB [birth year]) female.  Chief Complaint: No chief complaint on file.   HPI [name] presents to the office today for follow-up of anxiety, depression, and insomnia.  Describes mood today as "ok". Pleasant. Mood symptoms - reports decreased depression, anxiety, and irritability. Stating "I'm doing alright". She and husband doing well. Recently visited family in [place] to see grand-daughter. Stable interest and motivation. Taking medications as prescribed.  Energy levels stable. Active, does not have a regular exercise routine.  Enjoys some usual interests and activities. Married. Lives with husband of 69 years. 2 sons - one in [place] and other son and wife in [place] and 41 month old granddaughter.  Appetite adequate. Weight stable - 136 pounds. Sleeps well most nights. Averages 8 to 9 hours. Focus and concentration stable. Completing tasks. Managing aspects of household. Working part time in a [employer] - 3 days a week for 3 hours a day. Denies SI or HI. Denies AH or VH.    PHQ2-9     Office Visit from 11/03/2019 in [place] at [facility] from 05/26/2017 in [place] at [facility] Total Score 0 2       Review of Systems:  Review of Systems  Musculoskeletal: Negative for gait problem.  Neurological: Negative for tremors.  Psychiatric/Behavioral:       Please refer to HPI    Medications: I have reviewed the patient's current medications.  Current Outpatient Medications  Medication Sig Dispense Refill  . ARIPiprazole (ABILIFY) 2 MG tablet Take 1 tablet (2 mg total) by mouth daily. 90 tablet 3  . ARIPIPRAZOLE, SENSOR, PO Take 0.25 mg by mouth daily.    Marland Kitchen atorvastatin (LIPITOR) 10 MG tablet Take 1 tablet (10 mg total) by mouth daily. 90 tablet 3  . Calcium Citrate-Vitamin D (CALCIUM CITRATE + D PO) Take by mouth.    .  escitalopram (LEXAPRO) 20 MG tablet Take 1 tablet (20 mg total) by mouth daily. 90 tablet 3  . hydrOXYzine (ATARAX/VISTARIL) 25 MG tablet Take one capsule up to four times a day for anxiety/sleep. 120 tablet 5  . LUTEIN PO Take by mouth.     Current Facility-Administered Medications  Medication Dose Route Frequency Provider Last Rate Last Admin  . 0.9 %  sodium chloride infusion  500 mL Intravenous Once Mansouraty, Telford Nab., MD        Medication Side Effects: None  Allergies: No Known Allergies  Past Medical History:  Diagnosis Date  . Depression   . Hyperlipidemia   . Sciatica   . Tobacco use disorder, continuous     Family History  Problem Relation Age of Onset  . Colon cancer Neg Hx   . Colon polyps Neg Hx   . Esophageal cancer Neg Hx   . Stomach cancer Neg Hx   . Rectal cancer Neg Hx     Social History   Socioeconomic History  . Marital status: Married    Spouse name: Not on file  . Number of children: Not on file  . Years of education: Not on file  . Highest education level: Not on file  Occupational History  . Not on file  Tobacco Use  . Smoking status: Current Every Day Smoker    Types: Cigarettes  . Smokeless tobacco: Never Used  Vaping Use  . Vaping Use: Never used  Substance and Sexual Activity  .  Alcohol use: No  . Drug use: No  . Sexual activity: Not on file  Other Topics Concern  . Not on file  Social History Narrative  . Not on file   Social Determinants of Health   Financial Resource Strain:   . Difficulty of Paying Living Expenses: Not on file  Food Insecurity:   . Worried About Charity fundraiser in the Last Year: Not on file  . Ran Out of Food in the Last Year: Not on file  Transportation Needs:   . Lack of Transportation (Medical): Not on file  . Lack of Transportation (Non-Medical): Not on file  Physical Activity:   . Days of Exercise per Week: Not on file  . Minutes of Exercise per Session: Not on file  Stress:   . Feeling  of Stress : Not on file  Social Connections:   . Frequency of Communication with Friends and Family: Not on file  . Frequency of Social Gatherings with Friends and Family: Not on file  . Attends Religious Services: Not on file  . Active Member of Clubs or Organizations: Not on file  . Attends Archivist Meetings: Not on file  . Marital Status: Not on file  Intimate Partner Violence:   . Fear of Current or Ex-Partner: Not on file  . Emotionally Abused: Not on file  . Physically Abused: Not on file  . Sexually Abused: Not on file    Past Medical History, Surgical history, Social history, and Family history were reviewed and updated as appropriate.   Please see review of systems for further details on the patient's review from today.   Objective:   Physical Exam:  There were no vitals taken for this visit.  Physical Exam Constitutional:      General: She is not in acute distress. Musculoskeletal:        General: No deformity.  Neurological:     Mental Status: She is alert and oriented to person, place, and time.     Coordination: Coordination normal.  Psychiatric:        Attention and Perception: Attention and perception normal. She does not perceive auditory or visual hallucinations.        Mood and Affect: Mood normal. Mood is not anxious or depressed. Affect is not labile, blunt, angry or inappropriate.        Speech: Speech normal.        Behavior: Behavior normal.        Thought Content: Thought content normal. Thought content is not paranoid or delusional. Thought content does not include homicidal or suicidal ideation. Thought content does not include homicidal or suicidal plan.        Cognition and Memory: Cognition and memory normal.        Judgment: Judgment normal.     Comments: Insight intact     Lab Review:     Component Value Date/Time   NA 137 10/20/2019 1031   K 4.3 10/20/2019 1031   CL 103 10/20/2019 1031   CO2 31 10/20/2019 1031   GLUCOSE 86  10/20/2019 1031   BUN 10 10/20/2019 1031   CREATININE 0.75 10/20/2019 1031   CALCIUM 9.2 10/20/2019 1031   PROT 7.0 05/26/2017 1404   ALBUMIN 4.4 05/26/2017 1404   AST 22 05/26/2017 1404   ALT 13 05/26/2017 1404   ALKPHOS 69 05/26/2017 1404   BILITOT 0.6 05/26/2017 1404       Component Value Date/Time   WBC 8.9 10/20/2019  1031   RBC 4.46 10/20/2019 1031   HGB 13.4 10/20/2019 1031   HCT 40.3 10/20/2019 1031   PLT 311.0 10/20/2019 1031   MCV 90.4 10/20/2019 1031   MCHC 33.3 10/20/2019 1031   RDW 14.3 10/20/2019 1031   LYMPHSABS 2.3 10/20/2019 1031   MONOABS 0.7 10/20/2019 1031   EOSABS 0.2 10/20/2019 1031   BASOSABS 0.1 10/20/2019 1031    No results found for: POCLITH, LITHIUM   No results found for: PHENYTOIN, PHENOBARB, VALPROATE, CBMZ   .res Assessment: Plan:    Plan:  1. Abilify 2mg  daily 2. Hydroxyzine 25 - 4 daily prn anxiety/insomnia 3. Lexapro 20mg  daily   RTC 6 months  Patient advised to contact office with any questions, adverse effects, or acute worsening in signs and symptoms.  Discussed potential metabolic side effects associated with atypical antipsychotics, as well as potential risk for movement side effects. Advised pt to contact office if movement side effects occur.    Diagnoses and all orders for this visit:  Generalized anxiety disorder -     escitalopram (LEXAPRO) 20 MG tablet; Take 1 tablet (20 mg total) by mouth daily. -     ARIPiprazole (ABILIFY) 2 MG tablet; Take 1 tablet (2 mg total) by mouth daily.  Major depressive disorder, recurrent episode, moderate (HCC) -     escitalopram (LEXAPRO) 20 MG tablet; Take 1 tablet (20 mg total) by mouth daily. -     ARIPiprazole (ABILIFY) 2 MG tablet; Take 1 tablet (2 mg total) by mouth daily.  Insomnia, unspecified type -     hydrOXYzine (ATARAX/VISTARIL) 25 MG tablet; Take one capsule up to four times a day for anxiety/sleep.     Please see After Visit Summary for patient specific  instructions.  No future appointments.  No orders of the defined types were placed in this encounter.   -------------------------------

## 2020-05-31 DIAGNOSIS — B070 Plantar wart: Secondary | ICD-10-CM | POA: Diagnosis not present

## 2020-07-12 DIAGNOSIS — B070 Plantar wart: Secondary | ICD-10-CM | POA: Diagnosis not present

## 2020-10-22 ENCOUNTER — Ambulatory Visit (INDEPENDENT_AMBULATORY_CARE_PROVIDER_SITE_OTHER): Payer: Medicare Other | Admitting: Family Medicine

## 2020-10-22 ENCOUNTER — Encounter: Payer: Self-pay | Admitting: Family Medicine

## 2020-10-22 ENCOUNTER — Other Ambulatory Visit: Payer: Self-pay

## 2020-10-22 VITALS — BP 100/70 | HR 58 | Temp 98.4°F | Ht 63.0 in | Wt 137.4 lb

## 2020-10-22 DIAGNOSIS — E782 Mixed hyperlipidemia: Secondary | ICD-10-CM | POA: Diagnosis not present

## 2020-10-22 DIAGNOSIS — F419 Anxiety disorder, unspecified: Secondary | ICD-10-CM

## 2020-10-22 DIAGNOSIS — F17210 Nicotine dependence, cigarettes, uncomplicated: Secondary | ICD-10-CM | POA: Diagnosis not present

## 2020-10-22 DIAGNOSIS — Z0000 Encounter for general adult medical examination without abnormal findings: Secondary | ICD-10-CM

## 2020-10-22 LAB — BASIC METABOLIC PANEL
BUN: 14 mg/dL (ref 6–23)
CO2: 30 mEq/L (ref 19–32)
Calcium: 9.4 mg/dL (ref 8.4–10.5)
Chloride: 101 mEq/L (ref 96–112)
Creatinine, Ser: 0.76 mg/dL (ref 0.40–1.20)
GFR: 80.43 mL/min (ref >60.00)
Glucose, Bld: 90 mg/dL (ref 70–99)
Potassium: 4.4 mEq/L (ref 3.5–5.1)
Sodium: 139 mEq/L (ref 135–145)

## 2020-10-22 LAB — CBC WITH DIFFERENTIAL/PLATELET
Basophils Absolute: 0.1 10*3/uL (ref 0.0–0.1)
Basophils Relative: 0.6 % (ref 0.0–3.0)
Eosinophils Absolute: 0.1 10*3/uL (ref 0.0–0.7)
Eosinophils Relative: 1.3 % (ref 0.0–5.0)
HCT: 39.6 % (ref 36.0–46.0)
Hemoglobin: 13.5 g/dL (ref 12.0–15.0)
Lymphocytes Relative: 23.4 % (ref 12.0–46.0)
Lymphs Abs: 2.2 10*3/uL (ref 0.7–4.0)
MCHC: 34.1 g/dL (ref 30.0–36.0)
MCV: 89.2 fl (ref 78.0–100.0)
Monocytes Absolute: 0.7 10*3/uL (ref 0.1–1.0)
Monocytes Relative: 7.4 % (ref 3.0–12.0)
Neutro Abs: 6.4 10*3/uL (ref 1.4–7.7)
Neutrophils Relative %: 67.3 % (ref 43.0–77.0)
Platelets: 319 10*3/uL (ref 150.0–400.0)
RBC: 4.44 Mil/uL (ref 3.87–5.11)
RDW: 14.4 % (ref 11.5–15.5)
WBC: 9.6 10*3/uL (ref 4.0–10.5)

## 2020-10-22 LAB — LIPID PANEL
Cholesterol: 169 mg/dL (ref 0–200)
HDL: 57.3 mg/dL (ref >39.00)
LDL Cholesterol: 92 mg/dL (ref 0–99)
NonHDL: 112.1
Total CHOL/HDL Ratio: 3
Triglycerides: 102 mg/dL (ref 0.0–149.0)
VLDL: 20.4 mg/dL (ref 0.0–40.0)

## 2020-10-22 LAB — TSH: TSH: 2.1 u[IU]/mL (ref 0.35–4.50)

## 2020-10-22 LAB — T4, FREE: Free T4: 0.72 ng/dL (ref 0.60–1.60)

## 2020-10-22 LAB — HEMOGLOBIN A1C: Hgb A1c MFr Bld: 5.8 % (ref 4.6–6.5)

## 2020-10-22 MED ORDER — ATORVASTATIN CALCIUM 10 MG PO TABS: 10.0000 mg | ORAL_TABLET | Freq: Every day | ORAL | 3 refills | Status: DC

## 2020-10-22 NOTE — Progress Notes (Signed)
Subjective:     [name] is a 70 y.o. female and is here for a comprehensive physical exam. The patient reports no problems.  Endorses good mood, sleep, and appetite.  Patient doing yoga several days per week.  Smoking a little less than 1 pack/day.  Patient is not interested in quitting at this time.  Needs to schedule mammogram.  Social History   Socioeconomic History  . Marital status: Married    Spouse name: Not on file  . Number of children: Not on file  . Years of education: Not on file  . Highest education level: Not on file  Occupational History  . Not on file  Tobacco Use  . Smoking status: Current Every Day Smoker    Types: Cigarettes  . Smokeless tobacco: Never Used  Vaping Use  . Vaping Use: Never used  Substance and Sexual Activity  . Alcohol use: No  . Drug use: No  . Sexual activity: Not on file  Other Topics Concern  . Not on file  Social History Narrative  . Not on file   Social Determinants of Health   Financial Resource Strain: Not on file  Food Insecurity: Not on file  Transportation Needs: Not on file  Physical Activity: Not on file  Stress: Not on file  Social Connections: Not on file  Intimate Partner Violence: Not on file   Health Maintenance  Topic Date Due  . MAMMOGRAM  Never done  . DEXA SCAN  Never done  . COLONOSCOPY (Pts 45-66yrs Insurance coverage will need to be confirmed)  10/27/2022  . TETANUS/TDAP  10/19/2029  . INFLUENZA VACCINE  Completed  . COVID-19 Vaccine  Completed  . Hepatitis C Screening  Completed  . PNA vac Low Risk Adult  Completed  . HPV VACCINES  Aged Out    The following portions of the patient's history were reviewed and updated as appropriate: allergies, current medications, past family history, past medical history, past social history, past surgical history and problem list.  Review of Systems A comprehensive review of systems was negative.   Objective:    BP 100/70 (BP Location: Left Arm, Patient  Position: Sitting, Cuff Size: Normal)   Pulse (!) 58   Temp 98.4 F (36.9 C) (Oral)   Ht 5\' 3"  (1.6 m)   Wt 137 lb 6.4 oz (62.3 kg)   SpO2 96%   BMI 24.34 kg/m  General appearance: alert, cooperative and no distress Head: Normocephalic, without obvious abnormality, atraumatic Eyes: conjunctivae/corneas clear. PERRL, EOM's intact. Fundi benign. Ears: normal TM's and external ear canals both ears Nose: Nares normal. Septum midline. Mucosa normal. No drainage or sinus tenderness. Throat: lips, mucosa, and tongue normal; teeth and gums normal Neck: no adenopathy, no carotid bruit, no JVD, supple, symmetrical, trachea midline and thyroid not enlarged, symmetric, no tenderness/mass/nodules Lungs: clear to auscultation bilaterally Heart: regular rate and rhythm, S1, S2 normal, no murmur, click, rub or gallop Abdomen: soft, non-tender; bowel sounds normal; no masses,  no organomegaly Extremities: extremities normal, atraumatic, no cyanosis or edema Pulses: 2+ and symmetric Skin: Skin color, texture, turgor normal. No rashes or lesions Lymph nodes: Cervical, supraclavicular, and axillary nodes normal. Neurologic: Alert and oriented X 3, normal strength and tone. Normal symmetric reflexes. Normal coordination and gait    Assessment:    Healthy female exam.      Plan:     Anticipatory guidance given including wearing seatbelts, smoke detectors in the home, increasing physical activity, increasing p.o. intake of  water and vegetables. -will obtain labs -pt to schedule mammogram -Colonoscopy done 10/27/2019 given handout -Next CPE in 1 year See After Visit Summary for Counseling Recommendations    Mixed hyperlipidemia  -Continue lifestyle modification -Continue Lipitor 10 mg daily - Plan: Lipid panel, atorvastatin (LIPITOR) 10 MG tablet  Cigarette nicotine dependence without complication -Smoking cessation counseling greater than 3 minutes, less than 10 minutes -Smoking nearly 1  pack/day -Patient has no desire to quit -We will continue to ask at each [facility] -Given handout - Plan: CBC with Differential/Platelet  Anxiety  -Stable -Continue Abilify 2 mg daily and Lexapro 20 mg daily - Plan: TSH, T4, Free  Fu prn   Grier Mitts, MD

## 2020-10-22 NOTE — Patient Instructions (Signed)
Health Maintenance After Age 69 After age 64, you are at a higher risk for certain long-term diseases and infections as well as injuries from falls. Falls are a major cause of broken bones and head injuries in people who are older than age 41. Getting regular preventive care can help to keep you healthy and well. Preventive care includes getting regular testing and making lifestyle changes as recommended by your health care provider. Talk with your health care provider about:  Which screenings and tests you should have. A screening is a test that checks for a disease when you have no symptoms.  A diet and exercise plan that is right for you. What should I know about screenings and tests to prevent falls? Screening and testing are the best ways to find a health problem early. Early diagnosis and treatment give you the best chance of managing medical conditions that are common after age 19. Certain conditions and lifestyle choices may make you more likely to have a fall. Your health care provider may recommend:  Regular vision checks. Poor vision and conditions such as cataracts can make you more likely to have a fall. If you wear glasses, make sure to get your prescription updated if your vision changes.  Medicine review. Work with your health care provider to regularly review all of the medicines you are taking, including over-the-counter medicines. Ask your health care provider about any side effects that may make you more likely to have a fall. Tell your health care provider if any medicines that you take make you feel dizzy or sleepy.  Osteoporosis screening. Osteoporosis is a condition that causes the bones to get weaker. This can make the bones weak and cause them to break more easily.  Blood pressure screening. Blood pressure changes and medicines to control blood pressure can make you feel dizzy.  Strength and balance checks. Your health care provider may recommend certain tests to check your  strength and balance while standing, walking, or changing positions.  Foot health exam. Foot pain and numbness, as well as not wearing proper footwear, can make you more likely to have a fall.  Depression screening. You may be more likely to have a fall if you have a fear of falling, feel emotionally low, or feel unable to do activities that you used to do.  Alcohol use screening. Using too much alcohol can affect your balance and may make you more likely to have a fall. What actions can I take to lower my risk of falls? General instructions  Talk with your health care provider about your risks for falling. Tell your health care provider if: ? You fall. Be sure to tell your health care provider about all falls, even ones that seem minor. ? You feel dizzy, sleepy, or off-balance.  Take over-the-counter and prescription medicines only as told by your health care provider. These include any supplements.  Eat a healthy diet and maintain a healthy weight. A healthy diet includes low-fat dairy products, low-fat (lean) meats, and fiber from whole grains, beans, and lots of fruits and vegetables. Home safety  Remove any tripping hazards, such as rugs, cords, and clutter.  Install safety equipment such as grab bars in bathrooms and safety rails on stairs.  Keep rooms and walkways well-lit. Activity  Follow a regular exercise program to stay fit. This will help you maintain your balance. Ask your health care provider what types of exercise are appropriate for you.  If you need a cane or walker,  use it as recommended by your health care provider.  Wear supportive shoes that have nonskid soles.   Lifestyle  Do not drink alcohol if your health care provider tells you not to drink.  If you drink alcohol, limit how much you have: ? 0-1 drink a day for women. ? 0-2 drinks a day for men.  Be aware of how much alcohol is in your drink. In the [place] one drink equals one typical bottle of beer (12  oz), one-half glass of wine (5 oz), or one shot of hard liquor (1 oz).  Do not use any products that contain nicotine or tobacco, such as cigarettes and e-cigarettes. If you need help quitting, ask your health care provider. Summary  Having a healthy lifestyle and getting preventive care can help to protect your health and wellness after age 13.  Screening and testing are the best way to find a health problem early and help you avoid having a fall. Early diagnosis and treatment give you the best chance for managing medical conditions that are more common for people who are older than age 52.  Falls are a major cause of broken bones and head injuries in people who are older than age 69. Take precautions to prevent a fall at home.  Work with your health care provider to learn what changes you can make to improve your health and wellness and to prevent falls. This information is not intended to replace advice given to you by your health care provider. Make sure you discuss any questions you have with your health care provider. Document Revised: 11/11/2018 Document Reviewed: 06/03/2017 Elsevier Patient Education  2021 [place].  Dyslipidemia Dyslipidemia is an imbalance of waxy, fat-like substances (lipids) in the blood. The body needs lipids in small amounts. Dyslipidemia often involves a high level of cholesterol or triglycerides, which are types of lipids. Common forms of dyslipidemia include:  High levels of LDL cholesterol. LDL is the type of cholesterol that causes fatty deposits (plaques) to build up in the blood vessels that carry blood away from your heart (arteries).  Low levels of HDL cholesterol. HDL cholesterol is the type of cholesterol that protects against heart disease. High levels of HDL remove the LDL buildup from arteries.  High levels of triglycerides. Triglycerides are a fatty substance in the blood that is linked to a buildup of plaques in the arteries. What are the  causes? Primary dyslipidemia is caused by changes (mutations) in genes that are passed down through families (inherited). These mutations cause several types of dyslipidemia. Secondary dyslipidemia is caused by lifestyle choices and diseases that lead to dyslipidemia, such as:  Eating a diet that is high in animal fat.  Not getting enough exercise.  Having diabetes, kidney disease, liver disease, or thyroid disease.  Drinking large amounts of alcohol.  Using certain medicines. What increases the risk? You are more likely to develop this condition if you are an older man or if you are a woman who has gone through menopause. Other risk factors include:  Having a family history of dyslipidemia.  Taking certain medicines, including birth control pills, steroids, some diuretics, and beta-blockers.  Smoking cigarettes.  Eating a high-fat diet.  Having certain medical conditions such as diabetes, polycystic ovary syndrome (PCOS), kidney disease, liver disease, or hypothyroidism.  Not exercising regularly.  Being overweight or obese with too much belly fat. What are the signs or symptoms? In most cases, dyslipidemia does not usually cause any symptoms. In severe cases,  very high lipid levels can cause:  Fatty bumps under the skin (xanthomas).  White or gray ring around the black center (pupil) of the eye. Very high triglyceride levels can cause inflammation of the pancreas (pancreatitis). How is this diagnosed? Your health care provider may diagnose dyslipidemia based on a routine blood test (fasting blood test). Because most people do not have symptoms of the condition, this blood testing (lipid profile) is done on adults age 43 and older and is repeated every 5 years. This test checks:  Total cholesterol. This measures the total amount of cholesterol in your blood, including LDL cholesterol, HDL cholesterol, and triglycerides. A healthy number is below 200.  LDL cholesterol. The  target number for LDL cholesterol is different for each person, depending on individual risk factors. Ask your health care provider what your LDL cholesterol should be.  HDL cholesterol. An HDL level of 60 or higher is best because it helps to protect against heart disease. A number below 110 for men or below 55 for women increases the risk for heart disease.  Triglycerides. A healthy triglyceride number is below 150. If your lipid profile is abnormal, your health care provider may do other blood tests.   How is this treated? Treatment depends on the type of dyslipidemia that you have and your other risk factors for heart disease and stroke. Your health care provider will have a target range for your lipid levels based on this information. For many people, this condition may be treated by lifestyle changes, such as diet and exercise. Your health care provider may recommend that you:  Get regular exercise.  Make changes to your diet.  Quit smoking if you smoke. If diet changes and exercise do not help you reach your goals, your health care provider may also prescribe medicine to lower lipids. The most commonly prescribed type of medicine lowers your LDL cholesterol (statin drug). If you have a high triglyceride level, your provider may prescribe another type of drug (fibrate) or an omega-3 fish oil supplement, or both. Follow these instructions at home: Eating and drinking  Follow instructions from your health care provider or dietitian about eating or drinking restrictions.  Eat a healthy diet as told by your health care provider. This can help you reach and maintain a healthy weight, lower your LDL cholesterol, and raise your HDL cholesterol. This may include: ? Limiting your calories, if you are overweight. ? Eating more fruits, vegetables, whole grains, fish, and lean meats. ? Limiting saturated fat, trans fat, and cholesterol.  If you drink alcohol: ? Limit how much you use. ? Be aware  of how much alcohol is in your drink. In the [place] one drink equals one 12 oz bottle of beer (355 mL), one 5 oz glass of wine (148 mL), or one 1 oz glass of hard liquor (44 mL).  Do not drink alcohol if: ? Your health care provider tells you not to drink. ? You are pregnant, may be pregnant, or are planning to become pregnant. Activity  Get regular exercise. Start an exercise and strength training program as told by your health care provider. Ask your health care provider what activities are safe for you. Your health care provider may recommend: ? 30 minutes of aerobic activity 4-6 days a week. Brisk walking is an example of aerobic activity. ? Strength training 2 days a week. General instructions  Do not use any products that contain nicotine or tobacco, such as cigarettes, e-cigarettes, and chewing tobacco. If  you need help quitting, ask your health care provider.  Take over-the-counter and prescription medicines only as told by your health care provider. This includes supplements.  Keep all follow-up visits as told by your health care provider.   Contact a health care provider if:  You are: ? Having trouble sticking to your exercise or diet plan. ? Struggling to quit smoking or control your use of alcohol. Summary  Dyslipidemia often involves a high level of cholesterol or triglycerides, which are types of lipids.  Treatment depends on the type of dyslipidemia that you have and your other risk factors for heart disease and stroke.  For many people, treatment starts with lifestyle changes, such as diet and exercise.  Your health care provider may prescribe medicine to lower lipids. This information is not intended to replace advice given to you by your health care provider. Make sure you discuss any questions you have with your health care provider. Document Revised: 03/15/2018 Document Reviewed: 02/19/2018 Elsevier Patient Education  [place].  Steps to Quit  Smoking Smoking tobacco is the leading cause of preventable death. It can affect almost every organ in the body. Smoking puts you and people around you at risk for many serious, long-lasting (chronic) diseases. Quitting smoking can be hard, but it is one of the best things that you can do for your health. It is never too late to quit. How do I get ready to quit? When you decide to quit smoking, make a plan to help you succeed. Before you quit:  Pick a date to quit. Set a date within the next 2 weeks to give you time to prepare.  Write down the reasons why you are quitting. Keep this list in places where you will see it often.  Tell your family, friends, and co-workers that you are quitting. Their support is important.  Talk with your doctor about the choices that may help you quit.  Find out if your health insurance will pay for these treatments.  Know the people, places, things, and activities that make you want to smoke (triggers). Avoid them. What first steps can I take to quit smoking?  Throw away all cigarettes at home, at work, and in your car.  Throw away the things that you use when you smoke, such as ashtrays and lighters.  Clean your car. Make sure to empty the ashtray.  Clean your home, including curtains and carpets. What can I do to help me quit smoking? Talk with your doctor about taking medicines and seeing a counselor at the same time. You are more likely to succeed when you do both.  If you are pregnant or breastfeeding, talk with your doctor about counseling or other ways to quit smoking. Do not take medicine to help you quit smoking unless your doctor tells you to do so. To quit smoking: Quit right away  Quit smoking totally, instead of slowly cutting back on how much you smoke over a period of time.  Go to counseling. You are more likely to quit if you go to counseling sessions regularly. Take medicine You may take medicines to help you quit. Some medicines  need a prescription, and some you can buy over-the-counter. Some medicines may contain a drug called nicotine to replace the nicotine in cigarettes. Medicines may:  Help you to stop having the desire to smoke (cravings).  Help to stop the problems that come when you stop smoking (withdrawal symptoms). Your doctor may ask you to use:  Nicotine patches, gum, or lozenges.  Nicotine inhalers or sprays.  Non-nicotine medicine that is taken by mouth. Find resources Find resources and other ways to help you quit smoking and remain smoke-free after you quit. These resources are most helpful when you use them often. They include:  Online chats with a Social worker.  Phone quitlines.  Printed Furniture conservator/restorer.  Support groups or group counseling.  Text messaging programs.  Mobile phone apps. Use apps on your mobile phone or tablet that can help you stick to your quit plan. There are many free apps for mobile phones and tablets as well as websites. Examples include Quit Guide from the [employer] and smokefree.gov   What things can I do to make it easier to quit?  Talk to your family and friends. Ask them to support and encourage you.  Call a phone quitline (1-800-QUIT-NOW), reach out to support groups, or work with a Social worker.  Ask people who smoke to not smoke around you.  Avoid places that make you want to smoke, such as: ? Bars. ? Parties. ? Smoke-break areas at work.  Spend time with people who do not smoke.  Lower the stress in your life. Stress can make you want to smoke. Try these things to help your stress: ? Getting regular exercise. ? Doing deep-breathing exercises. ? Doing yoga. ? Meditating. ? Doing a body scan. To do this, close your eyes, focus on one area of your body at a time from head to toe. Notice which parts of your body are tense. Try to relax the muscles in those areas.   How will I feel when I quit smoking? Day 1 to 3 weeks Within the first 24 hours, you may  start to have some problems that come from quitting tobacco. These problems are very bad 2-3 days after you quit, but they do not often last for more than 2-3 weeks. You may get these symptoms:  Mood swings.  Feeling restless, nervous, angry, or annoyed.  Trouble concentrating.  Dizziness.  Strong desire for high-sugar foods and nicotine.  Weight gain.  Trouble pooping (constipation).  Feeling like you may vomit (nausea).  Coughing or a sore throat.  Changes in how the medicines that you take for other issues work in your body.  Depression.  Trouble sleeping (insomnia). Week 3 and afterward After the first 2-3 weeks of quitting, you may start to notice more positive results, such as:  Better sense of smell and taste.  Less coughing and sore throat.  Slower heart rate.  Lower blood pressure.  Clearer skin.  Better breathing.  Fewer sick days. Quitting smoking can be hard. Do not give up if you fail the first time. Some people need to try a few times before they succeed. Do your best to stick to your quit plan, and talk with your doctor if you have any questions or concerns. Summary  Smoking tobacco is the leading cause of preventable death. Quitting smoking can be hard, but it is one of the best things that you can do for your health.  When you decide to quit smoking, make a plan to help you succeed.  Quit smoking right away, not slowly over a period of time.  When you start quitting, seek help from your doctor, family, or friends. This information is not intended to replace advice given to you by your health care provider. Make sure you discuss any questions you have with your health care provider. Document Revised: 04/15/2019 Document Reviewed: 10/09/2018  Elsevier Patient Education  2021 [employer].

## 2020-10-23 ENCOUNTER — Other Ambulatory Visit: Payer: Self-pay

## 2020-10-23 DIAGNOSIS — F331 Major depressive disorder, recurrent, moderate: Secondary | ICD-10-CM

## 2020-10-23 DIAGNOSIS — F411 Generalized anxiety disorder: Secondary | ICD-10-CM

## 2020-10-23 MED ORDER — ESCITALOPRAM OXALATE 20 MG PO TABS: 20.0000 mg | ORAL_TABLET | Freq: Every day | ORAL | 0 refills | Status: DC

## 2020-10-30 ENCOUNTER — Encounter: Payer: Self-pay | Admitting: Adult Health

## 2020-10-30 ENCOUNTER — Other Ambulatory Visit: Payer: Self-pay

## 2020-10-30 ENCOUNTER — Ambulatory Visit (INDEPENDENT_AMBULATORY_CARE_PROVIDER_SITE_OTHER): Payer: Medicare Other | Admitting: Adult Health

## 2020-10-30 DIAGNOSIS — F411 Generalized anxiety disorder: Secondary | ICD-10-CM

## 2020-10-30 DIAGNOSIS — F331 Major depressive disorder, recurrent, moderate: Secondary | ICD-10-CM

## 2020-10-30 DIAGNOSIS — G4700 Insomnia, unspecified: Secondary | ICD-10-CM | POA: Diagnosis not present

## 2020-10-30 MED ORDER — ESCITALOPRAM OXALATE 20 MG PO TABS: 20.0000 mg | ORAL_TABLET | Freq: Every day | ORAL | 3 refills | Status: DC

## 2020-10-30 MED ORDER — HYDROXYZINE HCL 25 MG PO TABS
ORAL_TABLET | ORAL | 5 refills | Status: DC
Start: 2020-10-30 — End: 2021-05-02

## 2020-10-30 MED ORDER — ARIPIPRAZOLE 2 MG PO TABS: 2.0000 mg | ORAL_TABLET | Freq: Every day | ORAL | 3 refills | Status: DC

## 2020-10-30 NOTE — Progress Notes (Signed)
[name] [id number] May 08, 1951 70 y.o.  Subjective:   Patient ID:  [name] is a 70 y.o. (DOB 1950-12-02) female.  Chief Complaint: No chief complaint on file.   HPI [name] presents to the office today for follow-up of anxiety, depression, and insomnia.  Describes mood today as "ok". Pleasant. Mood symptoms - reports decreased depression, anxiety, and irritability. Stating "I have some low moments", but I'm able to work through it". Feels like the Abilify has helped to keep her "leveled" out.  Missing her parents. She and husband doing well. Stable interest and motivation. Taking medications as prescribed.  Energy levels stable. Active, does not have a regular exercise routine.  Enjoys some usual interests and activities. Married. Lives with husband of 34 years. 2 sons - one in [place] and other son and wife in [place] and 37 month old granddaughter.  Appetite adequate. Weight stable - 136 pounds. Sleeps well most nights. Averages 8 to 9 hours. Focus and concentration stable. Completing tasks. Managing aspects of household. Working part time in a [employer]. Denies SI or HI.  Denies AH or VH.    [place] Office Visit from 11/03/2019 in [place] at [facility] from 05/26/2017 in [place] at [facility] Total Score 0 2       Review of Systems:  Review of Systems  Musculoskeletal: Negative for gait problem.  Neurological: Negative for tremors.  Psychiatric/Behavioral:       Please refer to HPI    Medications: I have reviewed the patient's current medications.  Current Outpatient Medications  Medication Sig Dispense Refill  . ARIPiprazole (ABILIFY) 2 MG tablet Take 1 tablet (2 mg total) by mouth daily. 90 tablet 3  . ARIPIPRAZOLE, SENSOR, PO Take 0.25 mg by mouth daily.    Marland Kitchen atorvastatin (LIPITOR) 10 MG tablet Take 1 tablet (10 mg total) by mouth daily. 90 tablet 3  . Calcium Citrate-Vitamin D (CALCIUM  CITRATE + D PO) Take by mouth.    . escitalopram (LEXAPRO) 20 MG tablet Take 1 tablet (20 mg total) by mouth daily. 90 tablet 3  . hydrOXYzine (ATARAX/VISTARIL) 25 MG tablet Take one capsule up to four times a day for anxiety/sleep. 120 tablet 5  . LUTEIN PO Take by mouth.     Current Facility-Administered Medications  Medication Dose Route Frequency Provider Last Rate Last Admin  . 0.9 %  sodium chloride infusion  500 mL Intravenous Once Mansouraty, Telford Nab., MD        Medication Side Effects: None  Allergies: No Known Allergies  Past Medical History:  Diagnosis Date  . Depression   . Hyperlipidemia   . Sciatica   . Tobacco use disorder, continuous     Family History  Problem Relation Age of Onset  . Colon cancer Neg Hx   . Colon polyps Neg Hx   . Esophageal cancer Neg Hx   . Stomach cancer Neg Hx   . Rectal cancer Neg Hx     Social History   Socioeconomic History  . Marital status: Married    Spouse name: Not on file  . Number of children: Not on file  . Years of education: Not on file  . Highest education level: Not on file  Occupational History  . Not on file  Tobacco Use  . Smoking status: Current Every Day Smoker    Types: Cigarettes  . Smokeless tobacco: Never Used  Vaping Use  . Vaping Use: Never used  Substance and Sexual Activity  . Alcohol use: No  . Drug use: No  . Sexual activity: Not on file  Other Topics Concern  . Not on file  Social History Narrative  . Not on file   Social Determinants of Health   Financial Resource Strain: Not on file  Food Insecurity: Not on file  Transportation Needs: Not on file  Physical Activity: Not on file  Stress: Not on file  Social Connections: Not on file  Intimate Partner Violence: Not on file    Past Medical History, Surgical history, Social history, and Family history were reviewed and updated as appropriate.   Please see review of systems for further details on the patient's review from today.    Objective:   Physical Exam:  There were no vitals taken for this visit.  Physical Exam Constitutional:      General: She is not in acute distress. Musculoskeletal:        General: No deformity.  Neurological:     Mental Status: She is alert and oriented to person, place, and time.     Coordination: Coordination normal.  Psychiatric:        Attention and Perception: Attention and perception normal. She does not perceive auditory or visual hallucinations.        Mood and Affect: Mood normal. Mood is not anxious or depressed. Affect is not labile, blunt, angry or inappropriate.        Speech: Speech normal.        Behavior: Behavior normal.        Thought Content: Thought content normal. Thought content is not paranoid or delusional. Thought content does not include homicidal or suicidal ideation. Thought content does not include homicidal or suicidal plan.        Cognition and Memory: Cognition and memory normal.        Judgment: Judgment normal.     Comments: Insight intact     Lab Review:     Component Value Date/Time   NA 139 10/22/2020 1125   K 4.4 10/22/2020 1125   CL 101 10/22/2020 1125   CO2 30 10/22/2020 1125   GLUCOSE 90 10/22/2020 1125   BUN 14 10/22/2020 1125   CREATININE 0.76 10/22/2020 1125   CALCIUM 9.4 10/22/2020 1125   PROT 7.0 05/26/2017 1404   ALBUMIN 4.4 05/26/2017 1404   AST 22 05/26/2017 1404   ALT 13 05/26/2017 1404   ALKPHOS 69 05/26/2017 1404   BILITOT 0.6 05/26/2017 1404       Component Value Date/Time   WBC 9.6 10/22/2020 1125   RBC 4.44 10/22/2020 1125   HGB 13.5 10/22/2020 1125   HCT 39.6 10/22/2020 1125   PLT 319.0 10/22/2020 1125   MCV 89.2 10/22/2020 1125   MCHC 34.1 10/22/2020 1125   RDW 14.4 10/22/2020 1125   LYMPHSABS 2.2 10/22/2020 1125   MONOABS 0.7 10/22/2020 1125   EOSABS 0.1 10/22/2020 1125   BASOSABS 0.1 10/22/2020 1125    No results found for: POCLITH, LITHIUM   No results found for: PHENYTOIN, PHENOBARB,  VALPROATE, CBMZ   .res Assessment: Plan:    Plan:  1. Abilify 2mg  daily 2. Hydroxyzine 25 - 4 daily prn anxiety/insomnia 3. Lexapro 20mg  daily   RTC 6 months  Patient advised to contact office with any questions, adverse effects, or acute worsening in signs and symptoms.  Discussed potential metabolic side effects associated with atypical antipsychotics, as well as potential risk for movement side effects. Advised pt to  contact office if movement side effects occur.      Diagnoses and all orders for this visit:  Insomnia, unspecified type -     hydrOXYzine (ATARAX/VISTARIL) 25 MG tablet; Take one capsule up to four times a day for anxiety/sleep.  Major depressive disorder, recurrent episode, moderate (HCC) -     escitalopram (LEXAPRO) 20 MG tablet; Take 1 tablet (20 mg total) by mouth daily. -     ARIPiprazole (ABILIFY) 2 MG tablet; Take 1 tablet (2 mg total) by mouth daily.  Generalized anxiety disorder -     escitalopram (LEXAPRO) 20 MG tablet; Take 1 tablet (20 mg total) by mouth daily. -     ARIPiprazole (ABILIFY) 2 MG tablet; Take 1 tablet (2 mg total) by mouth daily.     Please see After Visit Summary for patient specific instructions.  No future appointments.  No orders of the defined types were placed in this encounter.   -------------------------------

## 2020-12-27 ENCOUNTER — Other Ambulatory Visit: Payer: Self-pay | Admitting: Family Medicine

## 2020-12-27 DIAGNOSIS — E782 Mixed hyperlipidemia: Secondary | ICD-10-CM

## 2021-01-07 ENCOUNTER — Telehealth: Payer: Self-pay

## 2021-01-07 NOTE — Telephone Encounter (Signed)
Prior Approval received for HYDROXYZINE 25 MG effective 01/06/2021-01/06/2022 with [place]  Medicare ID# [id number]

## 2021-01-08 ENCOUNTER — Other Ambulatory Visit: Payer: Self-pay

## 2021-01-08 ENCOUNTER — Ambulatory Visit (INDEPENDENT_AMBULATORY_CARE_PROVIDER_SITE_OTHER): Payer: Medicare Other

## 2021-01-08 VITALS — BP 110/62 | HR 76 | Temp 98.7°F | Wt 139.1 lb

## 2021-01-08 DIAGNOSIS — Z0000 Encounter for general adult medical examination without abnormal findings: Secondary | ICD-10-CM

## 2021-01-08 DIAGNOSIS — E2839 Other primary ovarian failure: Secondary | ICD-10-CM | POA: Diagnosis not present

## 2021-01-08 DIAGNOSIS — Z1231 Encounter for screening mammogram for malignant neoplasm of breast: Secondary | ICD-10-CM | POA: Diagnosis not present

## 2021-01-08 NOTE — Progress Notes (Addendum)
Subjective:   [name] is a 70 y.o. female who presents for an Initial Medicare Annual Wellness Visit.  Review of Systems     Cardiac Risk Factors include: advanced age (>55men, >23 women);dyslipidemia;smoking/ tobacco exposure     Objective:    Today's Vitals   01/08/21 1521  BP: 110/62  Pulse: 76  Temp: 98.7 F (37.1 C)  SpO2: 95%  Weight: 139 lb 1.6 oz (63.1 kg)   Body mass index is 24.64 kg/m.  Advanced Directives 01/08/2021  Does Patient Have a Medical Advance Directive? No  Would patient like information on creating a medical advance directive? No - Patient declined    Current Medications (verified) Outpatient Encounter Medications as of 01/08/2021  Medication Sig  . ARIPiprazole (ABILIFY) 2 MG tablet Take 1 tablet (2 mg total) by mouth daily.  Marland Kitchen atorvastatin (LIPITOR) 10 MG tablet Take 1 tablet (10 mg total) by mouth daily.  . Calcium Citrate-Vitamin D (CALCIUM CITRATE + D PO) Take by mouth.  . escitalopram (LEXAPRO) 20 MG tablet Take 1 tablet (20 mg total) by mouth daily.  . hydrOXYzine (ATARAX/VISTARIL) 25 MG tablet Take one capsule up to four times a day for anxiety/sleep.  . LUTEIN PO Take by mouth.  . ARIPIPRAZOLE, SENSOR, PO Take 0.25 mg by mouth daily. (Patient not taking: Reported on 01/08/2021)   Facility-Administered Encounter Medications as of 01/08/2021  Medication  . 0.9 %  sodium chloride infusion    Allergies (verified) Patient has no known allergies.   History: Past Medical History:  Diagnosis Date  . Depression   . Hyperlipidemia   . Sciatica   . Tobacco use disorder, continuous    Past Surgical History:  Procedure Laterality Date  . CESAREAN SECTION    . fractured heel bone     Family History  Problem Relation Age of Onset  . Colon cancer Neg Hx   . Colon polyps Neg Hx   . Esophageal cancer Neg Hx   . Stomach cancer Neg Hx   . Rectal cancer Neg Hx    Social History   Socioeconomic History  . Marital status: Married     Spouse name: Not on file  . Number of children: Not on file  . Years of education: Not on file  . Highest education level: Not on file  Occupational History  . Not on file  Tobacco Use  . Smoking status: Current Every Day Smoker    Packs/day: 1.00    Types: Cigarettes  . Smokeless tobacco: Never Used  Vaping Use  . Vaping Use: Never used  Substance and Sexual Activity  . Alcohol use: No  . Drug use: No  . Sexual activity: Not on file  Other Topics Concern  . Not on file  Social History Narrative  . Not on file   Social Determinants of Health   Financial Resource Strain: Low Risk   . Difficulty of Paying Living Expenses: Not hard at all  Food Insecurity: No Food Insecurity  . Worried About Charity fundraiser in the Last Year: Never true  . Ran Out of Food in the Last Year: Never true  Transportation Needs: No Transportation Needs  . Lack of Transportation (Medical): No  . Lack of Transportation (Non-Medical): No  Physical Activity: Insufficiently Active  . Days of Exercise per Week: 2 days  . Minutes of Exercise per Session: 40 min  Stress: No Stress Concern Present  . Feeling of Stress : Not at all  Social  Connections: Moderately Isolated  . Frequency of Communication with Friends and Family: More than three times a week  . Frequency of Social Gatherings with Friends and Family: More than three times a week  . Attends Religious Services: Never  . Active Member of Clubs or Organizations: No  . Attends Archivist Meetings: Never  . Marital Status: Married    Tobacco Counseling Ready to quit: Not Answered Counseling given: Not Answered   Clinical Intake:  Pre-visit preparation completed: Yes  Pain : No/denies pain     BMI - recorded: 24.64 Nutritional Status: BMI of 19-24  Normal Nutritional Risks: None Diabetes: No  How often do you need to have someone help you when you read instructions, pamphlets, or other written materials from your doctor  or pharmacy?: 1 - Never  Diabetic?No  Interpreter Needed?: No  Information entered by :: Charlott Rakes, LPN   Activities of Daily Living In your present state of health, do you have any difficulty performing the following activities: 01/08/2021  Hearing? N  Vision? N  Difficulty concentrating or making decisions? N  Walking or climbing stairs? N  Dressing or bathing? N  Doing errands, shopping? N  Preparing Food and eating ? N  Using the Toilet? N  In the past six months, have you accidently leaked urine? N  Do you have problems with loss of bowel control? N  Managing your Medications? N  Managing your Finances? N  Housekeeping or managing your Housekeeping? N  Some recent data might be hidden    Patient Care Team: Billie Ruddy, MD as PCP - General (Family Medicine)  Indicate any recent Medical Services you may have received from other than [facility] providers in the past year (date may be approximate).     Assessment:   This is a routine wellness examination for [name].  Hearing/Vision screen  Hearing Screening   125Hz  250Hz  500Hz  1000Hz  2000Hz  3000Hz  4000Hz  6000Hz  8000Hz   Right ear:           Left ear:           Comments: Pt denies any hearing issues   Vision Screening Comments: Pt follows up with Provider annually   Dietary issues and exercise activities discussed: Current Exercise Habits: Structured exercise class, Type of exercise: yoga, Time (Minutes): 45, Frequency (Times/Week): 2, Weekly Exercise (Minutes/Week): 90  Goals Addressed            This Visit's Progress   . Patient Stated       Lose weight       Depression Screen PHQ 2/9 Scores 01/08/2021 11/03/2019 05/26/2017  PHQ - 2 Score 0 0 2    Fall Risk Fall Risk  01/08/2021 11/03/2019 05/26/2017  Falls in the past year? 0 0 No  Number falls in past yr: 0 0 -  Injury with Fall? 0 0 -  Risk for fall due to : Impaired vision No Fall Risks -  Follow up Falls prevention discussed Falls evaluation  completed -    FALL RISK PREVENTION PERTAINING TO THE HOME:  Any stairs in or around the home? Yes  If so, are there any without handrails? No  Home free of loose throw rugs in walkways, pet beds, electrical cords, etc? Yes  Adequate lighting in your home to reduce risk of falls? Yes   ASSISTIVE DEVICES UTILIZED TO PREVENT FALLS:  Life alert? No  Use of a cane, walker or w/c? No  Grab bars in the bathroom? No  Shower chair or bench in shower? No  Elevated toilet seat or a handicapped toilet? No   TIMED UP AND GO:  Was the test performed? Yes .  Length of time to ambulate 10 feet: 10 sec.   Gait steady and fast without use of assistive device  Cognitive Function:     6CIT Screen 01/08/2021  What Year? 0 points  What month? 0 points  What time? 0 points  Count back from 20 0 points  Months in reverse 0 points  Repeat phrase 0 points  Total Score 0    Immunizations Immunization History  Administered Date(s) Administered  . Influenza Split 05/18/2013, 05/19/2013  . Influenza, High Dose Seasonal PF 05/26/2017, 05/27/2018, 04/23/2019  . Influenza, Seasonal, Injecte, Preservative Fre 05/18/2015  . Influenza-Unspecified 05/17/2020  . PFIZER(Purple Top)SARS-COV-2 Vaccination 09/10/2019, 10/01/2019, 05/17/2020  . Pneumococcal Conjugate-13 05/26/2017  . Pneumococcal Polysaccharide-23 05/27/2018  . Tdap 10/20/2019    TDAP status: Up to date  Flu Vaccine status: Up to date  Pneumococcal vaccine status: Up to date  Covid-19 vaccine status: Completed vaccines  Qualifies for Shingles Vaccine? Yes   Zostavax completed No   Shingrix Completed?: No.    Education has been provided regarding the importance of this vaccine. Patient has been advised to call insurance company to determine out of pocket expense if they have not yet received this vaccine. Advised may also receive vaccine at local pharmacy or Health Dept. Verbalized acceptance and understanding.  Screening  Tests Health Maintenance  Topic Date Due  . MAMMOGRAM  Never done  . Zoster Vaccines- Shingrix (1 of 2) Never done  . DEXA SCAN  Never done  . Pneumococcal Vaccine 66-16 Years old (1 of 2 - PPSV23) Never done  . INFLUENZA VACCINE  03/04/2021  . COLONOSCOPY (Pts 45-25yrs Insurance coverage will need to be confirmed)  10/27/2022  . TETANUS/TDAP  10/19/2029  . COVID-19 Vaccine  Completed  . Hepatitis C Screening  Completed  . PNA vac Low Risk Adult  Completed  . HPV VACCINES  Aged Out    Health Maintenance  Health Maintenance Due  Topic Date Due  . MAMMOGRAM  Never done  . Zoster Vaccines- Shingrix (1 of 2) Never done  . DEXA SCAN  Never done  . Pneumococcal Vaccine 27-44 Years old (1 of 2 - PPSV23) Never done    Colorectal cancer screening: Type of screening: Colonoscopy. Completed 10/27/19. Repeat every 3 years  Mammogram status: Ordered 01/08/21. Pt provided with contact info and advised to call to schedule appt.   Bone Density status: Ordered 01/08/21. Pt provided with contact info and advised to call to schedule appt.  Lung Cancer Screening: (Low Dose CT Chest recommended if Age 68-80 years, 30 pack-year currently smoking OR have quit w/in 15years.) does qualify.   Lung Cancer Screening Referral: pt declined at this time  Additional Screening:  Hepatitis C Screening:  Completed 10/20/19  Vision Screening: Recommended annual ophthalmology exams for early detection of glaucoma and other disorders of the eye. Is the patient up to date with their annual eye exam?  Yes  Who is the provider or what is the name of the office in which the patient attends annual eye exams? Pt unsure of name  If pt is not established with a provider, would they like to be referred to a provider to establish care? No .   Dental Screening: Recommended annual dental exams for proper oral hygiene  Community Resource Referral / Chronic Care Management: CRR required  this visit?  No   CCM required this  visit?  No      Plan:     I have personally reviewed and noted the following in the patient's chart:   . Medical and social history . Use of alcohol, tobacco or illicit drugs  . Current medications and supplements including opioid prescriptions. Patient is not currently taking opioid prescriptions. . Functional ability and status . Nutritional status . Physical activity . Advanced directives . List of other physicians . Hospitalizations, surgeries, and ER visits in previous 12 months . Vitals . Screenings to include cognitive, depression, and falls . Referrals and appointments  In addition, I have reviewed and discussed with patient certain preventive protocols, quality metrics, and best practice recommendations. A written personalized care plan for preventive services as well as general preventive health recommendations were provided to patient.     Willette Brace, LPN   0/09/1113   Nurse Notes: None

## 2021-01-08 NOTE — Patient Instructions (Signed)
[name] , Thank you for taking time to come for your Medicare Wellness Visit. I appreciate your ongoing commitment to your health goals. Please review the following plan we discussed and let me know if I can assist you in the future.   Screening recommendations/referrals: Colonoscopy: Done 10/27/19 Mammogram: order place 01/08/21 Bone Density: Order placed 01/08/21 Recommended yearly ophthalmology/optometry visit for glaucoma screening and checkup Recommended yearly dental visit for hygiene and checkup  Vaccinations: Influenza vaccine: Up to date Pneumococcal vaccine: Up to date Tdap vaccine: Up to date Shingles vaccine: Shingrix discussed. Please contact your pharmacy for coverage information.    Covid-19:Completed 2/6, 2/27, & 05/17/20  Advanced directives: .Advance directive discussed with you today. Even though you declined this today please call our office should you change your mind and we can give you the proper paperwork for you to fill out.  Conditions/risks identified: Lose  weight   Next appointment: Follow up in one year for your annual wellness visit    Preventive Care 65 Years and Older, Female Preventive care refers to lifestyle choices and visits with your health care provider that can promote health and wellness. What does preventive care include?  A yearly physical exam. This is also called an annual well check.  Dental exams once or twice a year.  Routine eye exams. Ask your health care provider how often you should have your eyes checked.  Personal lifestyle choices, including:  Daily care of your teeth and gums.  Regular physical activity.  Eating a healthy diet.  Avoiding tobacco and drug use.  Limiting alcohol use.  Practicing safe sex.  Taking low-dose aspirin every day.  Taking vitamin and mineral supplements as recommended by your health care provider. What happens during an annual well check? The services and screenings done by your health  care provider during your annual well check will depend on your age, overall health, lifestyle risk factors, and family history of disease. Counseling  Your health care provider may ask you questions about your:  Alcohol use.  Tobacco use.  Drug use.  Emotional well-being.  Home and relationship well-being.  Sexual activity.  Eating habits.  History of falls.  Memory and ability to understand (cognition).  Work and work Statistician.  Reproductive health. Screening  You may have the following tests or measurements:  Height, weight, and BMI.  Blood pressure.  Lipid and cholesterol levels. These may be checked every 5 years, or more frequently if you are over 23 years old.  Skin check.  Lung cancer screening. You may have this screening every year starting at age 74 if you have a 30-pack-year history of smoking and currently smoke or have quit within the past 15 years.  Fecal occult blood test (FOBT) of the stool. You may have this test every year starting at age 46.  Flexible sigmoidoscopy or colonoscopy. You may have a sigmoidoscopy every 5 years or a colonoscopy every 10 years starting at age 61.  Hepatitis C blood test.  Hepatitis B blood test.  Sexually transmitted disease (STD) testing.  Diabetes screening. This is done by checking your blood sugar (glucose) after you have not eaten for a while (fasting). You may have this done every 1-3 years.  Bone density scan. This is done to screen for osteoporosis. You may have this done starting at age 80.  Mammogram. This may be done every 1-2 years. Talk to your health care provider about how often you should have regular mammograms. Talk with your health  care provider about your test results, treatment options, and if necessary, the need for more tests. Vaccines  Your health care provider may recommend certain vaccines, such as:  Influenza vaccine. This is recommended every year.  Tetanus, diphtheria, and  acellular pertussis (Tdap, Td) vaccine. You may need a Td booster every 10 years.  Zoster vaccine. You may need this after age 16.  Pneumococcal 13-valent conjugate (PCV13) vaccine. One dose is recommended after age 35.  Pneumococcal polysaccharide (PPSV23) vaccine. One dose is recommended after age 65. Talk to your health care provider about which screenings and vaccines you need and how often you need them. This information is not intended to replace advice given to you by your health care provider. Make sure you discuss any questions you have with your health care provider. Document Released: 08/17/2015 Document Revised: 04/09/2016 Document Reviewed: 05/22/2015 Elsevier Interactive Patient Education  2017 [employer] Prevention in the Home Falls can cause injuries. They can happen to people of all ages. There are many things you can do to make your home safe and to help prevent falls. What can I do on the outside of my home?  Regularly fix the edges of walkways and driveways and fix any cracks.  Remove anything that might make you trip as you walk through a door, such as a raised step or threshold.  Trim any bushes or trees on the path to your home.  Use bright outdoor lighting.  Clear any walking paths of anything that might make someone trip, such as rocks or tools.  Regularly check to see if handrails are loose or broken. Make sure that both sides of any steps have handrails.  Any raised decks and porches should have guardrails on the edges.  Have any leaves, snow, or ice cleared regularly.  Use sand or salt on walking paths during winter.  Clean up any spills in your garage right away. This includes oil or grease spills. What can I do in the bathroom?  Use night lights.  Install grab bars by the toilet and in the tub and shower. Do not use towel bars as grab bars.  Use non-skid mats or decals in the tub or shower.  If you need to sit down in the shower, use  a plastic, non-slip stool.  Keep the floor dry. Clean up any water that spills on the floor as soon as it happens.  Remove soap buildup in the tub or shower regularly.  Attach bath mats securely with double-sided non-slip rug tape.  Do not have throw rugs and other things on the floor that can make you trip. What can I do in the bedroom?  Use night lights.  Make sure that you have a light by your bed that is easy to reach.  Do not use any sheets or blankets that are too big for your bed. They should not hang down onto the floor.  Have a firm chair that has side arms. You can use this for support while you get dressed.  Do not have throw rugs and other things on the floor that can make you trip. What can I do in the kitchen?  Clean up any spills right away.  Avoid walking on wet floors.  Keep items that you use a lot in easy-to-reach places.  If you need to reach something above you, use a strong step stool that has a grab bar.  Keep electrical cords out of the way.  Do not use floor  polish or wax that makes floors slippery. If you must use wax, use non-skid floor wax.  Do not have throw rugs and other things on the floor that can make you trip. What can I do with my stairs?  Do not leave any items on the stairs.  Make sure that there are handrails on both sides of the stairs and use them. Fix handrails that are broken or loose. Make sure that handrails are as long as the stairways.  Check any carpeting to make sure that it is firmly attached to the stairs. Fix any carpet that is loose or worn.  Avoid having throw rugs at the top or bottom of the stairs. If you do have throw rugs, attach them to the floor with carpet tape.  Make sure that you have a light switch at the top of the stairs and the bottom of the stairs. If you do not have them, ask someone to add them for you. What else can I do to help prevent falls?  Wear shoes that:  Do not have high heels.  Have  rubber bottoms.  Are comfortable and fit you well.  Are closed at the toe. Do not wear sandals.  If you use a stepladder:  Make sure that it is fully opened. Do not climb a closed stepladder.  Make sure that both sides of the stepladder are locked into place.  Ask someone to hold it for you, if possible.  Clearly mark and make sure that you can see:  Any grab bars or handrails.  First and last steps.  Where the edge of each step is.  Use tools that help you move around (mobility aids) if they are needed. These include:  Canes.  Walkers.  Scooters.  Crutches.  Turn on the lights when you go into a dark area. Replace any light bulbs as soon as they burn out.  Set up your furniture so you have a clear path. Avoid moving your furniture around.  If any of your floors are uneven, fix them.  If there are any pets around you, be aware of where they are.  Review your medicines with your doctor. Some medicines can make you feel dizzy. This can increase your chance of falling. Ask your doctor what other things that you can do to help prevent falls. This information is not intended to replace advice given to you by your health care provider. Make sure you discuss any questions you have with your health care provider. Document Released: 05/17/2009 Document Revised: 12/27/2015 Document Reviewed: 08/25/2014 Elsevier Interactive Patient Education  2017 [employer].

## 2021-01-14 ENCOUNTER — Ambulatory Visit
Admission: RE | Admit: 2021-01-14 | Discharge: 2021-01-14 | Disposition: A | Discharge status: Home / Self Care | Payer: Medicare Other | Source: Ambulatory Visit | Attending: Family Medicine | Admitting: Family Medicine

## 2021-01-14 ENCOUNTER — Other Ambulatory Visit: Payer: Self-pay

## 2021-01-14 DIAGNOSIS — E2839 Other primary ovarian failure: Secondary | ICD-10-CM

## 2021-01-14 DIAGNOSIS — Z780 Asymptomatic menopausal state: Secondary | ICD-10-CM | POA: Diagnosis not present

## 2021-01-14 DIAGNOSIS — M8589 Other specified disorders of bone density and structure, multiple sites: Secondary | ICD-10-CM | POA: Diagnosis not present

## 2021-01-19 ENCOUNTER — Other Ambulatory Visit: Payer: Self-pay | Admitting: Adult Health

## 2021-01-19 DIAGNOSIS — F331 Major depressive disorder, recurrent, moderate: Secondary | ICD-10-CM

## 2021-01-19 DIAGNOSIS — F411 Generalized anxiety disorder: Secondary | ICD-10-CM

## 2021-01-31 DIAGNOSIS — L820 Inflamed seborrheic keratosis: Secondary | ICD-10-CM | POA: Diagnosis not present

## 2021-01-31 DIAGNOSIS — Z85828 Personal history of other malignant neoplasm of skin: Secondary | ICD-10-CM | POA: Diagnosis not present

## 2021-01-31 DIAGNOSIS — D2262 Melanocytic nevi of left upper limb, including shoulder: Secondary | ICD-10-CM | POA: Diagnosis not present

## 2021-01-31 DIAGNOSIS — L821 Other seborrheic keratosis: Secondary | ICD-10-CM | POA: Diagnosis not present

## 2021-03-04 DIAGNOSIS — H524 Presbyopia: Secondary | ICD-10-CM | POA: Diagnosis not present

## 2021-03-05 ENCOUNTER — Other Ambulatory Visit: Payer: Self-pay

## 2021-03-05 ENCOUNTER — Ambulatory Visit
Admission: RE | Admit: 2021-03-05 | Discharge: 2021-03-05 | Disposition: A | Discharge status: Home / Self Care | Payer: Medicare Other | Source: Ambulatory Visit | Attending: Family Medicine | Admitting: Family Medicine

## 2021-03-05 DIAGNOSIS — Z1231 Encounter for screening mammogram for malignant neoplasm of breast: Secondary | ICD-10-CM

## 2021-03-13 ENCOUNTER — Other Ambulatory Visit: Payer: Self-pay | Admitting: Family Medicine

## 2021-03-13 DIAGNOSIS — R928 Other abnormal and inconclusive findings on diagnostic imaging of breast: Secondary | ICD-10-CM

## 2021-04-18 ENCOUNTER — Ambulatory Visit: Admission: RE | Admit: 2021-04-18 | Payer: Medicare Other | Source: Ambulatory Visit

## 2021-04-18 ENCOUNTER — Ambulatory Visit
Admission: RE | Admit: 2021-04-18 | Discharge: 2021-04-18 | Disposition: A | Discharge status: Home / Self Care | Payer: Medicare Other | Source: Ambulatory Visit | Attending: Family Medicine | Admitting: Family Medicine

## 2021-04-18 ENCOUNTER — Other Ambulatory Visit: Payer: Self-pay

## 2021-04-18 DIAGNOSIS — R928 Other abnormal and inconclusive findings on diagnostic imaging of breast: Secondary | ICD-10-CM

## 2021-04-18 DIAGNOSIS — R922 Inconclusive mammogram: Secondary | ICD-10-CM | POA: Diagnosis not present

## 2021-05-02 ENCOUNTER — Encounter: Payer: Self-pay | Admitting: Adult Health

## 2021-05-02 ENCOUNTER — Other Ambulatory Visit: Payer: Self-pay

## 2021-05-02 ENCOUNTER — Ambulatory Visit: Payer: Medicare Other | Admitting: Adult Health

## 2021-05-02 DIAGNOSIS — G4700 Insomnia, unspecified: Secondary | ICD-10-CM

## 2021-05-02 DIAGNOSIS — F411 Generalized anxiety disorder: Secondary | ICD-10-CM

## 2021-05-02 DIAGNOSIS — F331 Major depressive disorder, recurrent, moderate: Secondary | ICD-10-CM | POA: Diagnosis not present

## 2021-05-02 MED ORDER — HYDROXYZINE HCL 25 MG PO TABS: ORAL_TABLET | ORAL | 5 refills | Status: DC

## 2021-05-02 NOTE — Progress Notes (Signed)
[name] [id number] 02-05-1951 70 y.o.  Subjective:   Patient ID:  [name] is a 70 y.o. (DOB 06-Aug-1950) female.  Chief Complaint: No chief complaint on file.   HPI [name] presents to the office today for follow-up of anxiety, depression, and insomnia.  Describes mood today as "ok". Pleasant. Mood symptoms - reports decreased depression, anxiety, and irritability - "not really". Stating "I feel like I'm doing real well". Feels like the Abilify and Lexapro are working well to stabilize mood. She and husband doing well. Stable interest and motivation. Taking medications as prescribed.  Energy levels stable. Active, does not have a regular exercise routine.  Enjoys some usual interests and activities. Married. Lives with husband of 86 years and 2 sons.  Appetite adequate. Weight stable - 136 pounds. Sleeps well most nights. Averages 8 to 9 hours. Focus and concentration stable. Completing tasks. Managing aspects of household. Works part time. Denies SI or HI.  Denies AH or VH.   PHQ2-9    Flowsheet Row Clinical Support from 01/08/2021 in [place] at [facility] from 11/03/2019 in [place] at [facility] from 05/26/2017 in [place] at [facility] Total Score 0 0 2        Review of Systems:  Review of Systems  Musculoskeletal:  Negative for gait problem.  Neurological:  Negative for tremors.  Psychiatric/Behavioral:         Please refer to HPI   Medications: I have reviewed the patient's current medications.  Current Outpatient Medications  Medication Sig Dispense Refill   ARIPiprazole (ABILIFY) 2 MG tablet Take 1 tablet (2 mg total) by mouth daily. 90 tablet 3   ARIPIPRAZOLE, SENSOR, PO Take 0.25 mg by mouth daily. (Patient not taking: Reported on 01/08/2021)     atorvastatin (LIPITOR) 10 MG tablet Take 1 tablet (10 mg total) by mouth daily. 90 tablet 3   Calcium Citrate-Vitamin D (CALCIUM CITRATE + D  PO) Take by mouth.     escitalopram (LEXAPRO) 20 MG tablet Take 1 tablet (20 mg total) by mouth daily. 90 tablet 3   hydrOXYzine (ATARAX/VISTARIL) 25 MG tablet Take one capsule up to four times a day for anxiety/sleep. 120 tablet 5   LUTEIN PO Take by mouth.     Current Facility-Administered Medications  Medication Dose Route Frequency Provider Last Rate Last Admin   0.9 %  sodium chloride infusion  500 mL Intravenous Once Mansouraty, Telford Nab., MD        Medication Side Effects: None  Allergies: No Known Allergies  Past Medical History:  Diagnosis Date   Depression    Hyperlipidemia    Sciatica    Tobacco use disorder, continuous     Past Medical History, Surgical history, Social history, and Family history were reviewed and updated as appropriate.   Please see review of systems for further details on the patient's review from today.   Objective:   Physical Exam:  There were no vitals taken for this visit.  Physical Exam Constitutional:      General: She is not in acute distress. Musculoskeletal:        General: No deformity.  Neurological:     Mental Status: She is alert and oriented to person, place, and time.     Coordination: Coordination normal.  Psychiatric:        Attention and Perception: Attention and perception normal. She does not perceive auditory or visual hallucinations.  Mood and Affect: Mood normal. Mood is not anxious or depressed. Affect is not labile, blunt, angry or inappropriate.        Speech: Speech normal.        Behavior: Behavior normal.        Thought Content: Thought content normal. Thought content is not paranoid or delusional. Thought content does not include homicidal or suicidal ideation. Thought content does not include homicidal or suicidal plan.        Cognition and Memory: Cognition and memory normal.        Judgment: Judgment normal.     Comments: Insight intact    Lab Review:     Component Value Date/Time   NA 139  10/22/2020 1125   K 4.4 10/22/2020 1125   CL 101 10/22/2020 1125   CO2 30 10/22/2020 1125   GLUCOSE 90 10/22/2020 1125   BUN 14 10/22/2020 1125   CREATININE 0.76 10/22/2020 1125   CALCIUM 9.4 10/22/2020 1125   PROT 7.0 05/26/2017 1404   ALBUMIN 4.4 05/26/2017 1404   AST 22 05/26/2017 1404   ALT 13 05/26/2017 1404   ALKPHOS 69 05/26/2017 1404   BILITOT 0.6 05/26/2017 1404       Component Value Date/Time   WBC 9.6 10/22/2020 1125   RBC 4.44 10/22/2020 1125   HGB 13.5 10/22/2020 1125   HCT 39.6 10/22/2020 1125   PLT 319.0 10/22/2020 1125   MCV 89.2 10/22/2020 1125   MCHC 34.1 10/22/2020 1125   RDW 14.4 10/22/2020 1125   LYMPHSABS 2.2 10/22/2020 1125   MONOABS 0.7 10/22/2020 1125   EOSABS 0.1 10/22/2020 1125   BASOSABS 0.1 10/22/2020 1125    No results found for: POCLITH, LITHIUM   No results found for: PHENYTOIN, PHENOBARB, VALPROATE, CBMZ   .res Assessment: Plan:     Plan:  1. Abilify 2mg  daily 2. Hydroxyzine 25 - 4 daily prn anxiety/insomnia 3. Lexapro 20mg  daily   RTC 6 months  Patient advised to contact office with any questions, adverse effects, or acute worsening in signs and symptoms.  Discussed potential metabolic side effects associated with atypical antipsychotics, as well as potential risk for movement side effects. Advised pt to contact office if movement side effects occur.    Diagnoses and all orders for this visit:  Insomnia, unspecified type -     hydrOXYzine (ATARAX/VISTARIL) 25 MG tablet; Take one capsule up to four times a day for anxiety/sleep.    Please see After Visit Summary for patient specific instructions.  Future Appointments  Date Time Provider [place]  01/21/2022  3:15 PM [facility] HEALTH ADVISOR 2 [facility]    No orders of the defined types were placed in this encounter.   -------------------------------

## 2021-05-23 DIAGNOSIS — Z23 Encounter for immunization: Secondary | ICD-10-CM | POA: Diagnosis not present

## 2021-10-25 ENCOUNTER — Other Ambulatory Visit: Payer: Self-pay | Admitting: Adult Health

## 2021-10-25 DIAGNOSIS — F331 Major depressive disorder, recurrent, moderate: Secondary | ICD-10-CM

## 2021-10-25 DIAGNOSIS — F411 Generalized anxiety disorder: Secondary | ICD-10-CM

## 2021-10-31 ENCOUNTER — Ambulatory Visit: Payer: Medicare Other | Admitting: Adult Health

## 2021-10-31 ENCOUNTER — Encounter: Payer: Self-pay | Admitting: Adult Health

## 2021-10-31 DIAGNOSIS — G4700 Insomnia, unspecified: Secondary | ICD-10-CM | POA: Diagnosis not present

## 2021-10-31 DIAGNOSIS — F411 Generalized anxiety disorder: Secondary | ICD-10-CM | POA: Diagnosis not present

## 2021-10-31 DIAGNOSIS — F331 Major depressive disorder, recurrent, moderate: Secondary | ICD-10-CM | POA: Diagnosis not present

## 2021-10-31 MED ORDER — ESCITALOPRAM OXALATE 20 MG PO TABS: 20.0000 mg | ORAL_TABLET | Freq: Every day | ORAL | 3 refills | Status: DC

## 2021-10-31 MED ORDER — ARIPIPRAZOLE 2 MG PO TABS: 2.0000 mg | ORAL_TABLET | Freq: Every day | ORAL | 3 refills | Status: DC

## 2021-10-31 MED ORDER — HYDROXYZINE HCL 25 MG PO TABS: ORAL_TABLET | ORAL | 5 refills | Status: DC

## 2021-10-31 NOTE — Progress Notes (Signed)
[place] ?[id number] ?Nov 29, 1950 ?71 y.o. ? ?Subjective:  ? ?Patient ID:  [name] is a 71 y.o. (DOB April 02, 1951) female. ? ?Chief Complaint: No chief complaint on file. ? ? ?HPI ? ?[name] presents to the office today for follow-up of anxiety, depression, and insomnia. ? ?Describes mood today as "ok". Pleasant. Mood symptoms - reports decreased depression - "has melancholy moments". Denies anxiety, and irritability. Stating I'm doing pretty good". Feels like the Abilify and Lexapro continue to work well. She and husband doing well. Stable interest and motivation. Taking medications as prescribed.  ?Energy levels stable. Active, does not have a regular exercise routine.  ?Enjoys some usual interests and activities. Married. Lives with husband - 2 grown sons - one in [place] and one in [place].  ?Appetite adequate. Weight stable - 136 pounds. ?Sleeps well most nights. Averages 8 to 9 hours. ?Focus and concentration stable. Completing tasks. Managing aspects of household. Works part time - 20 hours. ?Denies SI or HI.  ?Denies AH or VH. ? ? ?PHQ2-9   ? ?Flowsheet Row Clinical Support from 01/08/2021 in [place] at [facility] from 11/03/2019 in [place] at [facility] from 05/26/2017 in [place] at [facility]  ?PHQ-2 Total Score 0 0 2  ? ?  ?  ? ?Review of Systems:  ?Review of Systems  ?Musculoskeletal:  Negative for gait problem.  ?Neurological:  Negative for tremors.  ?Psychiatric/Behavioral:    ?     Please refer to HPI  ? ?Medications: I have reviewed the patient's current medications. ? ?Current Outpatient Medications  ?Medication Sig Dispense Refill  ? ARIPiprazole (ABILIFY) 2 MG tablet Take 1 tablet (2 mg total) by mouth daily. 90 tablet 3  ? ARIPIPRAZOLE, SENSOR, PO Take 0.25 mg by mouth daily. (Patient not taking: Reported on 01/08/2021)    ? atorvastatin (LIPITOR) 10 MG tablet Take 1 tablet (10 mg total) by mouth daily. 90 tablet 3  ? Calcium  Citrate-Vitamin D (CALCIUM CITRATE + D PO) Take by mouth.    ? escitalopram (LEXAPRO) 20 MG tablet Take 1 tablet (20 mg total) by mouth daily. 90 tablet 3  ? hydrOXYzine (ATARAX) 25 MG tablet Take one capsule up to four times a day for anxiety/sleep. 120 tablet 5  ? LUTEIN PO Take by mouth.    ? ?Current Facility-Administered Medications  ?Medication Dose Route Frequency Provider Last Rate Last Admin  ? 0.9 %  sodium chloride infusion  500 mL Intravenous Once Mansouraty, Telford Nab., MD      ? ? ?Medication Side Effects: None ? ?Allergies: No Known Allergies ? ?Past Medical History:  ?Diagnosis Date  ? Depression   ? Hyperlipidemia   ? Sciatica   ? Tobacco use disorder, continuous   ? ? ?Past Medical History, Surgical history, Social history, and Family history were reviewed and updated as appropriate.  ? ?Please see review of systems for further details on the patient's review from today.  ? ?Objective:  ? ?Physical Exam:  ?There were no vitals taken for this visit. ? ?Physical Exam ?Constitutional:   ?   General: She is not in acute distress. ?Musculoskeletal:     ?   General: No deformity.  ?Neurological:  ?   Mental Status: She is alert and oriented to person, place, and time.  ?   Coordination: Coordination normal.  ?Psychiatric:     ?   Attention and Perception: Attention and perception normal. She does not perceive auditory or  visual hallucinations.     ?   Mood and Affect: Mood normal. Mood is not anxious or depressed. Affect is not labile, blunt, angry or inappropriate.     ?   Speech: Speech normal.     ?   Behavior: Behavior normal.     ?   Thought Content: Thought content normal. Thought content is not paranoid or delusional. Thought content does not include homicidal or suicidal ideation. Thought content does not include homicidal or suicidal plan.     ?   Cognition and Memory: Cognition and memory normal.     ?   Judgment: Judgment normal.  ?   Comments: Insight intact  ? ? ?Lab Review:  ?   ?Component  Value Date/Time  ? NA 139 10/22/2020 1125  ? K 4.4 10/22/2020 1125  ? CL 101 10/22/2020 1125  ? CO2 30 10/22/2020 1125  ? GLUCOSE 90 10/22/2020 1125  ? BUN 14 10/22/2020 1125  ? CREATININE 0.76 10/22/2020 1125  ? CALCIUM 9.4 10/22/2020 1125  ? PROT 7.0 05/26/2017 1404  ? ALBUMIN 4.4 05/26/2017 1404  ? AST 22 05/26/2017 1404  ? ALT 13 05/26/2017 1404  ? ALKPHOS 69 05/26/2017 1404  ? BILITOT 0.6 05/26/2017 1404  ? ? ?   ?Component Value Date/Time  ? WBC 9.6 10/22/2020 1125  ? RBC 4.44 10/22/2020 1125  ? HGB 13.5 10/22/2020 1125  ? HCT 39.6 10/22/2020 1125  ? PLT 319.0 10/22/2020 1125  ? MCV 89.2 10/22/2020 1125  ? MCHC 34.1 10/22/2020 1125  ? RDW 14.4 10/22/2020 1125  ? LYMPHSABS 2.2 10/22/2020 1125  ? MONOABS 0.7 10/22/2020 1125  ? EOSABS 0.1 10/22/2020 1125  ? BASOSABS 0.1 10/22/2020 1125  ? ? ?No results found for: POCLITH, LITHIUM  ? ?No results found for: PHENYTOIN, PHENOBARB, VALPROATE, CBMZ  ? ?.res ?Assessment: Plan:   ? ?Plan: ? ?1. Abilify '2mg'$  daily ?2. Hydroxyzine 25 - 4 daily prn anxiety/insomnia ?3. Lexapro '20mg'$  daily  ? ?RTC 6 months ? ?Patient advised to contact office with any questions, adverse effects, or acute worsening in signs and symptoms. ? ?Discussed potential metabolic side effects associated with atypical antipsychotics, as well as potential risk for movement side effects. Advised pt to contact office if movement side effects occur.  ? ?Diagnoses and all orders for this visit: ? ?Insomnia, unspecified type ?-     hydrOXYzine (ATARAX) 25 MG tablet; Take one capsule up to four times a day for anxiety/sleep. ? ?Major depressive disorder, recurrent episode, moderate ([place]) ?-     ARIPiprazole (ABILIFY) 2 MG tablet; Take 1 tablet (2 mg total) by mouth daily. ?-     escitalopram (LEXAPRO) 20 MG tablet; Take 1 tablet (20 mg total) by mouth daily. ? ?Generalized anxiety disorder ?-     ARIPiprazole (ABILIFY) 2 MG tablet; Take 1 tablet (2 mg total) by mouth daily. ?-     escitalopram (LEXAPRO) 20 MG  tablet; Take 1 tablet (20 mg total) by mouth daily. ? ?  ? ?Please see After Visit Summary for patient specific instructions. ? ?Future Appointments  ?Date Time Provider [place]  ?01/21/2022  3:15 PM [facility] HEALTH ADVISOR 2 [facility]  ? ? ?No orders of the defined types were placed in this encounter. ? ? ?------------------------------- ?

## 2021-11-14 ENCOUNTER — Ambulatory Visit (INDEPENDENT_AMBULATORY_CARE_PROVIDER_SITE_OTHER): Payer: Medicare Other | Admitting: Family Medicine

## 2021-11-14 ENCOUNTER — Encounter: Payer: Self-pay | Admitting: Family Medicine

## 2021-11-14 VITALS — BP 130/80 | HR 69 | Temp 98.3°F | Ht 64.0 in | Wt 138.2 lb

## 2021-11-14 DIAGNOSIS — M85852 Other specified disorders of bone density and structure, left thigh: Secondary | ICD-10-CM

## 2021-11-14 DIAGNOSIS — F419 Anxiety disorder, unspecified: Secondary | ICD-10-CM

## 2021-11-14 DIAGNOSIS — Z0000 Encounter for general adult medical examination without abnormal findings: Secondary | ICD-10-CM | POA: Diagnosis not present

## 2021-11-14 DIAGNOSIS — E782 Mixed hyperlipidemia: Secondary | ICD-10-CM | POA: Diagnosis not present

## 2021-11-14 DIAGNOSIS — F17210 Nicotine dependence, cigarettes, uncomplicated: Secondary | ICD-10-CM

## 2021-11-14 DIAGNOSIS — F32A Depression, unspecified: Secondary | ICD-10-CM

## 2021-11-14 LAB — CBC WITH DIFFERENTIAL/PLATELET
Basophils Absolute: 0.1 10*3/uL (ref 0.0–0.1)
Basophils Relative: 0.7 % (ref 0.0–3.0)
Eosinophils Absolute: 0.1 10*3/uL (ref 0.0–0.7)
Eosinophils Relative: 1.7 % (ref 0.0–5.0)
HCT: 41.1 % (ref 36.0–46.0)
Hemoglobin: 13.6 g/dL (ref 12.0–15.0)
Lymphocytes Relative: 25.2 % (ref 12.0–46.0)
Lymphs Abs: 2 10*3/uL (ref 0.7–4.0)
MCHC: 33.2 g/dL (ref 30.0–36.0)
MCV: 90.6 fl (ref 78.0–100.0)
Monocytes Absolute: 0.7 10*3/uL (ref 0.1–1.0)
Monocytes Relative: 8.4 % (ref 3.0–12.0)
Neutro Abs: 5.1 10*3/uL (ref 1.4–7.7)
Neutrophils Relative %: 64 % (ref 43.0–77.0)
Platelets: 284 10*3/uL (ref 150.0–400.0)
RBC: 4.54 Mil/uL (ref 3.87–5.11)
RDW: 14.3 % (ref 11.5–15.5)
WBC: 7.9 10*3/uL (ref 4.0–10.5)

## 2021-11-14 LAB — COMPREHENSIVE METABOLIC PANEL
ALT: 15 U/L (ref 0–35)
AST: 26 U/L (ref 0–37)
Albumin: 4.1 g/dL (ref 3.5–5.2)
Alkaline Phosphatase: 79 U/L (ref 39–117)
BUN: 13 mg/dL (ref 6–23)
CO2: 30 mEq/L (ref 19–32)
Calcium: 9.2 mg/dL (ref 8.4–10.5)
Chloride: 100 mEq/L (ref 96–112)
Creatinine, Ser: 0.8 mg/dL (ref 0.40–1.20)
GFR: 75.06 mL/min (ref >60.00)
Glucose, Bld: 86 mg/dL (ref 70–99)
Potassium: 4.2 mEq/L (ref 3.5–5.1)
Sodium: 135 mEq/L (ref 135–145)
Total Bilirubin: 0.5 mg/dL (ref 0.2–1.2)
Total Protein: 7 g/dL (ref 6.0–8.3)

## 2021-11-14 LAB — LIPID PANEL
Cholesterol: 158 mg/dL (ref 0–200)
HDL: 57.5 mg/dL (ref >39.00)
LDL Cholesterol: 86 mg/dL (ref 0–99)
NonHDL: 100.66
Total CHOL/HDL Ratio: 3
Triglycerides: 75 mg/dL (ref 0.0–149.0)
VLDL: 15 mg/dL (ref 0.0–40.0)

## 2021-11-14 LAB — TSH: TSH: 1.19 u[IU]/mL (ref 0.35–5.50)

## 2021-11-14 LAB — T4, FREE: Free T4: 0.85 ng/dL (ref 0.60–1.60)

## 2021-11-14 LAB — HEMOGLOBIN A1C: Hgb A1c MFr Bld: 5.8 % (ref 4.6–6.5)

## 2021-11-14 MED ORDER — ATORVASTATIN CALCIUM 10 MG PO TABS: 10.0000 mg | ORAL_TABLET | Freq: Every day | ORAL | 3 refills | Status: DC

## 2021-11-14 NOTE — Progress Notes (Signed)
Subjective:  ?  ? [name] is a 71 y.o. female and is here for a comprehensive physical exam. The patient reports doing well.  Pt has a part-time job and is doing yoga 2x/wk.  Pt requesting refill on Lipitor.  Denies myalgias, joint pain.  Endorses good mood, stable on current meds.  Currently smoking 15 cigarettes/day had mammogram September 2022.  Bone density done 01/14/2021 and colonoscopy done 10/27/2019. ? ?Social History  ? ?Socioeconomic History  ? Marital status: Married  ?  Spouse name: Not on file  ? Number of children: Not on file  ? Years of education: Not on file  ? Highest education level: Not on file  ?Occupational History  ? Not on file  ?Tobacco Use  ? Smoking status: Every Day  ?  Packs/day: 1.00  ?  Types: Cigarettes  ? Smokeless tobacco: Never  ?Vaping Use  ? Vaping Use: Never used  ?Substance and Sexual Activity  ? Alcohol use: No  ? Drug use: No  ? Sexual activity: Not on file  ?Other Topics Concern  ? Not on file  ?Social History Narrative  ? Not on file  ? ?Social Determinants of Health  ? ?Financial Resource Strain: Low Risk   ? Difficulty of Paying Living Expenses: Not hard at all  ?Food Insecurity: No Food Insecurity  ? Worried About Charity fundraiser in the Last Year: Never true  ? Ran Out of Food in the Last Year: Never true  ?Transportation Needs: No Transportation Needs  ? Lack of Transportation (Medical): No  ? Lack of Transportation (Non-Medical): No  ?Physical Activity: Insufficiently Active  ? Days of Exercise per Week: 2 days  ? Minutes of Exercise per Session: 40 min  ?Stress: No Stress Concern Present  ? Feeling of Stress : Not at all  ?Social Connections: Moderately Isolated  ? Frequency of Communication with Friends and Family: More than three times a week  ? Frequency of Social Gatherings with Friends and Family: More than three times a week  ? Attends Religious Services: Never  ? Active Member of Clubs or Organizations: No  ? Attends Archivist Meetings:  Never  ? Marital Status: Married  ?Intimate Partner Violence: Not At Risk  ? Fear of Current or Ex-Partner: No  ? Emotionally Abused: No  ? Physically Abused: No  ? Sexually Abused: No  ? ?Health Maintenance  ?Topic Date Due  ? Zoster Vaccines- Shingrix (1 of 2) Never done  ? INFLUENZA VACCINE  03/04/2022  ? COLONOSCOPY (Pts 45-59yr Insurance coverage will need to be confirmed)  10/27/2022  ? MAMMOGRAM  03/06/2023  ? TETANUS/TDAP  10/19/2029  ? Pneumonia Vaccine 70 Years old  Completed  ? DEXA SCAN  Completed  ? COVID-19 Vaccine  Completed  ? Hepatitis C Screening  Completed  ? HPV VACCINES  Aged Out  ? ? ?The following portions of the patient's history were reviewed and updated as appropriate: allergies, current medications, past family history, past medical history, past social history, past surgical history, and problem list. ? ?Review of Systems ?Pertinent items noted in HPI and remainder of comprehensive ROS otherwise negative.  ? ?Objective:  ? ? BP 130/80 (BP Location: Left Arm, Patient Position: Sitting)   Pulse 69   Temp 98.3 ?F (36.8 ?C) (Oral)   Ht '5\' 4"'$  (1.626 m)   Wt 138 lb 3.2 oz (62.7 kg)   SpO2 98%   BMI 23.72 kg/m?  ?General appearance: alert, cooperative, and  no distress ?Head: Normocephalic, without obvious abnormality, atraumatic ?Eyes: conjunctivae/corneas clear. PERRL, EOM's intact. Fundi benign. ?Ears: normal TM's and external ear canals both ears ?Nose: Nares normal. Septum midline. Mucosa normal. No drainage or sinus tenderness. ?Throat: lips, mucosa, and tongue normal; teeth and gums normal ?Neck: no adenopathy, no carotid bruit, no JVD, supple, symmetrical, trachea midline, and thyroid not enlarged, symmetric, no tenderness/mass/nodules ?Lungs: clear to auscultation bilaterally ?Heart: regular rate and rhythm, S1, S2 normal, no murmur, click, rub or gallop ?Abdomen: soft, non-tender; bowel sounds normal; no masses,  no organomegaly ?Extremities: extremities normal, atraumatic, no  cyanosis or edema ?Pulses: 2+ and symmetric ?Skin: Skin color, texture, turgor normal. No rashes or lesions ?Lymph nodes: Cervical, supraclavicular, and axillary nodes normal. ?Neurologic: Alert and oriented X 3, normal strength and tone. Normal symmetric reflexes. Normal coordination and gait  ?  ? ?  11/14/2021  ?  9:42 AM 01/08/2021  ?  3:25 PM 11/03/2019  ?  3:00 PM  ?Depression screen PHQ 2/9  ?Decreased Interest 0 0 0  ?Down, Depressed, Hopeless 0 0 0  ?PHQ - 2 Score 0 0 0  ?Altered sleeping 0    ?Tired, decreased energy 0    ?Change in appetite 0    ?Feeling bad or failure about yourself  0    ?Trouble concentrating 0    ?Moving slowly or fidgety/restless 0    ?Suicidal thoughts 0    ?PHQ-9 Score 0    ?Difficult doing work/chores Not difficult at all    ? ? ?Assessment:  ? ? Healthy female exam.   ?  ?Plan:  ? ? Anticipatory guidance given including wearing seatbelts, smoke detectors in the home, increasing physical activity, increasing p.o. intake of water and vegetables. ?-Obtain labs ?-Mammogram up-to-date, done 04/18/2021.  Repeat 04/2022 ?-Colonoscopy up-to-date done 10/27/2019 ?-Bone density done 01/14/2021 ?-Immunizations reviewed and up-to-date.  Patient to consider shingles vaccine ?-Given handout ?-Next CPE in 1 year ?See After Visit Summary for Counseling Recommendations  ? ?Mixed hyperlipidemia ?-Lipid panel 10/22/2020 controlled on Lipitor 10 mg ?-Continue Lipitor 10 mg and lifestyle modifications ?- Plan: CBC with Differential/Platelet, TSH, T4, Free, Hemoglobin A1c, Lipid panel, CMP, atorvastatin (LIPITOR) 10 MG tablet ? ?Anxiety and depression ?-Stable ?-PHQ 9 score 0 ?-Continue Abilify 2 mg daily and Lexapro 20 mg daily ?-Continue hydroxyzine 25 mg as needed ?-Continue follow-up with [facility] ?- Plan: TSH, T4, Free ? ?Cigarette nicotine dependence without complication ?-Currently smoking 15 cigarettes/day ?-No desire to quit ?-Continue to encourage cessation at each visit ? ?Osteopenia left femoral  neck ?-Bone density done 01/14/2021 with left femoral neck T score -2.3 ?-Continue yoga ?-Maximize intake of calcium and vitamin D ?-repeat bone density in 2 yrs, 01/2023 ? ?Follow-up in 4-6 months, sooner if needed ? ?Grier Mitts, MD ?

## 2021-11-27 ENCOUNTER — Other Ambulatory Visit: Payer: Self-pay | Admitting: Adult Health

## 2021-11-27 DIAGNOSIS — F331 Major depressive disorder, recurrent, moderate: Secondary | ICD-10-CM

## 2021-11-27 DIAGNOSIS — F411 Generalized anxiety disorder: Secondary | ICD-10-CM

## 2022-01-21 ENCOUNTER — Ambulatory Visit (INDEPENDENT_AMBULATORY_CARE_PROVIDER_SITE_OTHER): Payer: Medicare Other

## 2022-01-21 VITALS — BP 100/60 | HR 67 | Temp 98.4°F | Ht 62.5 in | Wt 133.5 lb

## 2022-01-21 DIAGNOSIS — Z0000 Encounter for general adult medical examination without abnormal findings: Secondary | ICD-10-CM

## 2022-01-21 NOTE — Progress Notes (Signed)
Subjective:   ADALENE GULOTTA is a 71 y.o. female who presents for Medicare Annual (Subsequent) preventive examination.  Review of Systems     Cardiac Risk Factors include: advanced age (>79mn, >>59women);dyslipidemia     Objective:    Today's Vitals   01/21/22 1504  BP: 100/60  Pulse: 67  Temp: 98.4 F (36.9 C)  TempSrc: Oral  SpO2: 95%  Weight: 133 lb 8 oz (60.6 kg)  Height: 5' 2.5" (1.588 m)   Body mass index is 24.03 kg/m.     01/21/2022    3:15 PM 01/08/2021    3:26 PM  Advanced Directives  Does Patient Have a Medical Advance Directive? No No  Would patient like information on creating a medical advance directive? Yes (MAU/Ambulatory/Procedural Areas - Information given) No - Patient declined    Current Medications (verified) Outpatient Encounter Medications as of 01/21/2022  Medication Sig   ARIPiprazole (ABILIFY) 2 MG tablet TAKE 1 TABLET(2 MG) BY MOUTH DAILY   atorvastatin (LIPITOR) 10 MG tablet Take 1 tablet (10 mg total) by mouth daily.   Calcium Citrate-Vitamin D (CALCIUM CITRATE + D PO) Take by mouth.   escitalopram (LEXAPRO) 20 MG tablet Take 1 tablet (20 mg total) by mouth daily.   hydrOXYzine (ATARAX) 25 MG tablet Take one capsule up to four times a day for anxiety/sleep.   LUTEIN PO Take by mouth.   ARIPIPRAZOLE, SENSOR, PO Take 0.25 mg by mouth daily. (Patient not taking: Reported on 01/21/2022)   Facility-Administered Encounter Medications as of 01/21/2022  Medication   0.9 %  sodium chloride infusion    Allergies (verified) Patient has no known allergies.   History: Past Medical History:  Diagnosis Date   Depression    Hyperlipidemia    Sciatica    Tobacco use disorder, continuous    Past Surgical History:  Procedure Laterality Date   CESAREAN SECTION     fractured heel bone     Family History  Problem Relation Age of Onset   Colon cancer Neg Hx    Colon polyps Neg Hx    Esophageal cancer Neg Hx    Stomach cancer Neg Hx    Rectal  cancer Neg Hx    Breast cancer Neg Hx    Social History   Socioeconomic History   Marital status: Married    Spouse name: Not on file   Number of children: Not on file   Years of education: Not on file   Highest education level: Not on file  Occupational History   Not on file  Tobacco Use   Smoking status: Every Day    Packs/day: 1.00    Types: Cigarettes   Smokeless tobacco: Never  Vaping Use   Vaping Use: Never used  Substance and Sexual Activity   Alcohol use: No   Drug use: No   Sexual activity: Not on file  Other Topics Concern   Not on file  Social History Narrative   Not on file   Social Determinants of Health   Financial Resource Strain: Low Risk  (01/21/2022)   Overall Financial Resource Strain (CARDIA)    Difficulty of Paying Living Expenses: Not hard at all  Food Insecurity: No Food Insecurity (01/21/2022)   Hunger Vital Sign    Worried About Running Out of Food in the Last Year: Never true    Ran Out of Food in the Last Year: Never true  Transportation Needs: No Transportation Needs (01/21/2022)   PRAPARE - Transportation  Lack of Transportation (Medical): No    Lack of Transportation (Non-Medical): No  Physical Activity: Insufficiently Active (01/21/2022)   Exercise Vital Sign    Days of Exercise per Week: 2 days    Minutes of Exercise per Session: 50 min  Stress: No Stress Concern Present (01/21/2022)   [place]    Feeling of Stress : Only a little  Social Connections: Moderately Isolated (01/08/2021)   Social Connection and Isolation Panel [NHANES]    Frequency of Communication with Friends and Family: More than three times a week    Frequency of Social Gatherings with Friends and Family: More than three times a week    Attends Religious Services: Never    Marine scientist or Organizations: No    Attends Music therapist: Never    Marital Status: Married     Tobacco Counseling Ready to quit: No Counseling given: Not Answered   Clinical Intake:  Pre-visit preparation completed: Yes  Pain : No/denies pain     Nutritional Status: BMI of 19-24  Normal Nutritional Risks: None Diabetes: No  How often do you need to have someone help you when you read instructions, pamphlets, or other written materials from your doctor or pharmacy?: 1 - Never What is the last grade level you completed in school?: 59yr tech school  Diabetic? no  Interpreter Needed?: No  Information entered by :: NAllen LPN   Activities of Daily Living    01/21/2022    3:17 PM  In your present state of health, do you have any difficulty performing the following activities:  Hearing? 0  Vision? 0  Difficulty concentrating or making decisions? 0  Walking or climbing stairs? 0  Dressing or bathing? 0  Doing errands, shopping? 0  Preparing Food and eating ? N  Using the Toilet? N  In the past six months, have you accidently leaked urine? Y  Comment with cough  Do you have problems with loss of bowel control? N  Managing your Medications? N  Managing your Finances? N  Housekeeping or managing your Housekeeping? N    Patient Care Team: BBillie Ruddy MD as PCP - General (Family Medicine)  Indicate any recent Medical Services you may have received from other than [facility] providers in the past year (date may be approximate).     Assessment:   This is a routine wellness examination for KAryel  Hearing/Vision screen Vision Screening - Comments:: Regular eye exams, [facility] [facility]  Dietary issues and exercise activities discussed: Current Exercise Habits: Structured exercise class, Type of exercise: yoga, Time (Minutes): 45, Frequency (Times/Week): 2, Weekly Exercise (Minutes/Week): 90   Goals Addressed             This Visit's Progress    Patient Stated       01/21/2022, wants to lose a pound or two       Depression Screen    01/21/2022     3:17 PM 11/14/2021    9:42 AM 01/08/2021    3:25 PM 11/03/2019    3:00 PM 05/26/2017   12:48 PM  PHQ 2/9 Scores  PHQ - 2 Score 0 0 0 0 2  PHQ- 9 Score  0       Fall Risk    01/21/2022    3:17 PM 11/14/2021    9:42 AM 01/08/2021    3:26 PM 11/03/2019    3:00 PM 05/26/2017   12:48 PM  Fall  Risk   Falls in the past year? 0 0 0 0 No  Number falls in past yr: 0 0 0 0   Injury with Fall? 0 0 0 0   Risk for fall due to : Medication side effect No Fall Risks Impaired vision No Fall Risks   Follow up Falls evaluation completed;Education provided;Falls prevention discussed Falls evaluation completed Falls prevention discussed Falls evaluation completed     FALL RISK PREVENTION PERTAINING TO THE HOME:  Any stairs in or around the home? Yes  If so, are there any without handrails? No  Home free of loose throw rugs in walkways, pet beds, electrical cords, etc? Yes  Adequate lighting in your home to reduce risk of falls? Yes   ASSISTIVE DEVICES UTILIZED TO PREVENT FALLS:  Life alert? No  Use of a cane, walker or w/c? No  Grab bars in the bathroom? No  Shower chair or bench in shower? No  Elevated toilet seat or a handicapped toilet? No   TIMED UP AND GO:  Was the test performed? No .    Gait steady and fast without use of assistive device  Cognitive Function:        01/21/2022    3:19 PM 01/08/2021    3:27 PM  6CIT Screen  What Year? 0 points 0 points  What month? 0 points 0 points  What time? 0 points 0 points  Count back from 20 0 points 0 points  Months in reverse 0 points 0 points  Repeat phrase 0 points 0 points  Total Score 0 points 0 points    Immunizations Immunization History  Administered Date(s) Administered   Influenza Split 05/18/2013, 05/19/2013   Influenza, High Dose Seasonal PF 05/26/2017, 05/27/2018, 04/23/2019, 05/23/2021   Influenza, Seasonal, Injecte, Preservative Fre 05/18/2015   Influenza-Unspecified 05/17/2020   PFIZER(Purple Top)SARS-COV-2  Vaccination 09/10/2019, 10/01/2019, 05/17/2020   Pfizer Covid-19 Vaccine Bivalent Booster 10yr & up 05/23/2021   Pneumococcal Conjugate-13 05/26/2017   Pneumococcal Polysaccharide-23 05/27/2018   Tdap 10/20/2019    TDAP status: Up to date  Flu Vaccine status: Up to date  Pneumococcal vaccine status: Up to date  Covid-19 vaccine status: Completed vaccines  Qualifies for Shingles Vaccine? Yes   Zostavax completed No   Shingrix Completed?: No.    Education has been provided regarding the importance of this vaccine. Patient has been advised to call insurance company to determine out of pocket expense if they have not yet received this vaccine. Advised may also receive vaccine at local pharmacy or Health Dept. Verbalized acceptance and understanding.  Screening Tests Health Maintenance  Topic Date Due   Zoster Vaccines- Shingrix (1 of 2) Never done   COVID-19 Vaccine (5 - Pfizer series) 09/23/2021   INFLUENZA VACCINE  03/04/2022   COLONOSCOPY (Pts 45-441yrInsurance coverage will need to be confirmed)  10/27/2022   MAMMOGRAM  03/06/2023   TETANUS/TDAP  10/19/2029   Pneumonia Vaccine [age]Years old  Completed   DEXA SCAN  Completed   Hepatitis C Screening  Completed   HPV VACCINES  Aged Out    Health Maintenance  Health Maintenance Due  Topic Date Due   Zoster Vaccines- Shingrix (1 of 2) Never done   COVID-19 Vaccine (5 - Pfizer series) 09/23/2021    Colorectal cancer screening: Type of screening: Colonoscopy. Completed 10/27/2019. Repeat every 3 years  Mammogram status: Completed 03/05/2021. Repeat every year  Bone Density status: Completed 01/14/2021.   Lung Cancer Screening: (Low Dose CT Chest recommended if  Age 34-80 years, 30 pack-year currently smoking OR have quit w/in 15years.) does not qualify.   Lung Cancer Screening Referral: no  Additional Screening:  Hepatitis C Screening: does qualify; Completed 10/20/2019  Vision Screening: Recommended annual ophthalmology  exams for early detection of glaucoma and other disorders of the eye. Is the patient up to date with their annual eye exam?  Yes  Who is the provider or what is the name of the office in which the patient attends annual eye exams? [facility] If pt is not established with a provider, would they like to be referred to a provider to establish care? No .   Dental Screening: Recommended annual dental exams for proper oral hygiene  Community Resource Referral / Chronic Care Management: CRR required this visit?  No   CCM required this visit?  No      Plan:     I have personally reviewed and noted the following in the patient's chart:   Medical and social history Use of alcohol, tobacco or illicit drugs  Current medications and supplements including opioid prescriptions.  Functional ability and status Nutritional status Physical activity Advanced directives List of other physicians Hospitalizations, surgeries, and ER visits in previous 12 months Vitals Screenings to include cognitive, depression, and falls Referrals and appointments  In addition, I have reviewed and discussed with patient certain preventive protocols, quality metrics, and best practice recommendations. A written personalized care plan for preventive services as well as general preventive health recommendations were provided to patient.     Kellie Simmering, LPN   12/04/7739   Nurse Notes: none

## 2022-01-21 NOTE — Patient Instructions (Signed)
[name] , Thank you for taking time to come for your Medicare Wellness Visit. I appreciate your ongoing commitment to your health goals. Please review the following plan we discussed and let me know if I can assist you in the future.   Screening recommendations/referrals: Colonoscopy: completed 10/27/2019, due 10/27/2022 Mammogram: completed 03/05/2021, due 03/06/2022 Bone Density: completed 01/14/2021 Recommended yearly ophthalmology/optometry visit for glaucoma screening and checkup Recommended yearly dental visit for hygiene and checkup  Vaccinations: Influenza vaccine: due 03/04/2022 Pneumococcal vaccine: completed 05/27/2018 Tdap vaccine: completed 10/20/2019, due 10/19/2029 Shingles vaccine: discussed   Covid-19: 05/23/2021, 05/17/2020, 10/01/2019, 09/10/2019  Advanced directives: Advance directive discussed with you today. I have provided a copy for you to complete at home and have notarized. Once this is complete please bring a copy in to our office so we can scan it into your chart.   Conditions/risks identified: smoking  Next appointment: Follow up in one year for your annual wellness visit    Preventive Care 77 Years and Older, Female Preventive care refers to lifestyle choices and visits with your health care provider that can promote health and wellness. What does preventive care include? A yearly physical exam. This is also called an annual well check. Dental exams once or twice a year. Routine eye exams. Ask your health care provider how often you should have your eyes checked. Personal lifestyle choices, including: Daily care of your teeth and gums. Regular physical activity. Eating a healthy diet. Avoiding tobacco and drug use. Limiting alcohol use. Practicing safe sex. Taking low-dose aspirin every day. Taking vitamin and mineral supplements as recommended by your health care provider. What happens during an annual well check? The services and screenings done by your  health care provider during your annual well check will depend on your age, overall health, lifestyle risk factors, and family history of disease. Counseling  Your health care provider may ask you questions about your: Alcohol use. Tobacco use. Drug use. Emotional well-being. Home and relationship well-being. Sexual activity. Eating habits. History of falls. Memory and ability to understand (cognition). Work and work Statistician. Reproductive health. Screening  You may have the following tests or measurements: Height, weight, and BMI. Blood pressure. Lipid and cholesterol levels. These may be checked every 5 years, or more frequently if you are over 74 years old. Skin check. Lung cancer screening. You may have this screening every year starting at age 10 if you have a 30-pack-year history of smoking and currently smoke or have quit within the past 15 years. Fecal occult blood test (FOBT) of the stool. You may have this test every year starting at age 25. Flexible sigmoidoscopy or colonoscopy. You may have a sigmoidoscopy every 5 years or a colonoscopy every 10 years starting at age 42. Hepatitis C blood test. Hepatitis B blood test. Sexually transmitted disease (STD) testing. Diabetes screening. This is done by checking your blood sugar (glucose) after you have not eaten for a while (fasting). You may have this done every 1-3 years. Bone density scan. This is done to screen for osteoporosis. You may have this done starting at age 57. Mammogram. This may be done every 1-2 years. Talk to your health care provider about how often you should have regular mammograms. Talk with your health care provider about your test results, treatment options, and if necessary, the need for more tests. Vaccines  Your health care provider may recommend certain vaccines, such as: Influenza vaccine. This is recommended every year. Tetanus, diphtheria, and acellular pertussis (  Tdap, Td) vaccine. You may  need a Td booster every 10 years. Zoster vaccine. You may need this after age 38. Pneumococcal 13-valent conjugate (PCV13) vaccine. One dose is recommended after age 44. Pneumococcal polysaccharide (PPSV23) vaccine. One dose is recommended after age 86. Talk to your health care provider about which screenings and vaccines you need and how often you need them. This information is not intended to replace advice given to you by your health care provider. Make sure you discuss any questions you have with your health care provider. Document Released: 08/17/2015 Document Revised: 04/09/2016 Document Reviewed: 05/22/2015 Elsevier Interactive Patient Education  2017 [place] Prevention in the Home Falls can cause injuries. They can happen to people of all ages. There are many things you can do to make your home safe and to help prevent falls. What can I do on the outside of my home? Regularly fix the edges of walkways and driveways and fix any cracks. Remove anything that might make you trip as you walk through a door, such as a raised step or threshold. Trim any bushes or trees on the path to your home. Use bright outdoor lighting. Clear any walking paths of anything that might make someone trip, such as rocks or tools. Regularly check to see if handrails are loose or broken. Make sure that both sides of any steps have handrails. Any raised decks and porches should have guardrails on the edges. Have any leaves, snow, or ice cleared regularly. Use sand or salt on walking paths during winter. Clean up any spills in your garage right away. This includes oil or grease spills. What can I do in the bathroom? Use night lights. Install grab bars by the toilet and in the tub and shower. Do not use towel bars as grab bars. Use non-skid mats or decals in the tub or shower. If you need to sit down in the shower, use a plastic, non-slip stool. Keep the floor dry. Clean up any water that spills on  the floor as soon as it happens. Remove soap buildup in the tub or shower regularly. Attach bath mats securely with double-sided non-slip rug tape. Do not have throw rugs and other things on the floor that can make you trip. What can I do in the bedroom? Use night lights. Make sure that you have a light by your bed that is easy to reach. Do not use any sheets or blankets that are too big for your bed. They should not hang down onto the floor. Have a firm chair that has side arms. You can use this for support while you get dressed. Do not have throw rugs and other things on the floor that can make you trip. What can I do in the kitchen? Clean up any spills right away. Avoid walking on wet floors. Keep items that you use a lot in easy-to-reach places. If you need to reach something above you, use a strong step stool that has a grab bar. Keep electrical cords out of the way. Do not use floor polish or wax that makes floors slippery. If you must use wax, use non-skid floor wax. Do not have throw rugs and other things on the floor that can make you trip. What can I do with my stairs? Do not leave any items on the stairs. Make sure that there are handrails on both sides of the stairs and use them. Fix handrails that are broken or loose. Make sure that handrails are  as long as the stairways. Check any carpeting to make sure that it is firmly attached to the stairs. Fix any carpet that is loose or worn. Avoid having throw rugs at the top or bottom of the stairs. If you do have throw rugs, attach them to the floor with carpet tape. Make sure that you have a light switch at the top of the stairs and the bottom of the stairs. If you do not have them, ask someone to add them for you. What else can I do to help prevent falls? Wear shoes that: Do not have high heels. Have rubber bottoms. Are comfortable and fit you well. Are closed at the toe. Do not wear sandals. If you use a stepladder: Make sure  that it is fully opened. Do not climb a closed stepladder. Make sure that both sides of the stepladder are locked into place. Ask someone to hold it for you, if possible. Clearly mark and make sure that you can see: Any grab bars or handrails. First and last steps. Where the edge of each step is. Use tools that help you move around (mobility aids) if they are needed. These include: Canes. Walkers. Scooters. Crutches. Turn on the lights when you go into a dark area. Replace any light bulbs as soon as they burn out. Set up your furniture so you have a clear path. Avoid moving your furniture around. If any of your floors are uneven, fix them. If there are any pets around you, be aware of where they are. Review your medicines with your doctor. Some medicines can make you feel dizzy. This can increase your chance of falling. Ask your doctor what other things that you can do to help prevent falls. This information is not intended to replace advice given to you by your health care provider. Make sure you discuss any questions you have with your health care provider. Document Released: 05/17/2009 Document Revised: 12/27/2015 Document Reviewed: 08/25/2014 Elsevier Interactive Patient Education  2017 [employer].

## 2022-03-11 DIAGNOSIS — H524 Presbyopia: Secondary | ICD-10-CM | POA: Diagnosis not present

## 2022-05-01 ENCOUNTER — Encounter: Payer: Self-pay | Admitting: Adult Health

## 2022-05-01 ENCOUNTER — Ambulatory Visit: Payer: Medicare Other | Admitting: Adult Health

## 2022-05-01 DIAGNOSIS — F331 Major depressive disorder, recurrent, moderate: Secondary | ICD-10-CM | POA: Diagnosis not present

## 2022-05-01 DIAGNOSIS — F411 Generalized anxiety disorder: Secondary | ICD-10-CM

## 2022-05-01 DIAGNOSIS — G4700 Insomnia, unspecified: Secondary | ICD-10-CM | POA: Diagnosis not present

## 2022-05-01 MED ORDER — ARIPIPRAZOLE 2 MG PO TABS: ORAL_TABLET | ORAL | 1 refills | Status: DC

## 2022-05-01 MED ORDER — ESCITALOPRAM OXALATE 20 MG PO TABS: 20.0000 mg | ORAL_TABLET | Freq: Every day | ORAL | 1 refills | Status: DC

## 2022-05-01 MED ORDER — HYDROXYZINE HCL 25 MG PO TABS: ORAL_TABLET | ORAL | 5 refills | Status: DC

## 2022-05-01 NOTE — Progress Notes (Signed)
[name] [id number] Oct 04, 1950 71 y.o.  Subjective:   Patient ID:  [name] is a 71 y.o. (DOB 1951/06/13) female.  Chief Complaint: No chief complaint on file.   HPI SEONA CLEMENSON presents to the office today for follow-up of anxiety, depression, and insomnia.  Describes mood today as "ok". Pleasant. Denies tearfulness. Mood symptoms - denies depression, anxiety and irritability. Mood is consistent. Stating I'm doing good". Feels like current medications continue to work well. She and husband doing well. Stable interest and motivation. Taking medications as prescribed.  Energy levels stable. Active, does not have a regular exercise routine.  Enjoys some usual interests and activities. Married. Lives with husband - 2 grown sons - one in [place] and one in [place].  Appetite adequate. Weight stable - 135 pounds. Sleeps well most nights. Averages 8 to 9 hours. Focus and concentration stable. Completing tasks. Managing aspects of household. Works part time - 20 hours. Denies SI or HI.  Denies AH or VH.   PHQ2-9    Flowsheet Row Clinical Support from 01/21/2022 in [place] at [place] from 11/14/2021 in [place] at [place] from 01/08/2021 in [place] at [facility] from 11/03/2019 in [place] at [facility] from 05/26/2017 in [place] at [facility] Total Score 0 0 0 0 2  PHQ-9 Total Score -- 0 -- -- --        Review of Systems:  Review of Systems  Musculoskeletal:  Negative for gait problem.  Neurological:  Negative for tremors.  Psychiatric/Behavioral:         Please refer to HPI    Medications: I have reviewed the patient's current medications.  Current Outpatient Medications  Medication Sig Dispense Refill   ARIPiprazole (ABILIFY) 2 MG tablet TAKE 1 TABLET(2 MG) BY MOUTH DAILY 90 tablet 1   ARIPIPRAZOLE, SENSOR, PO Take 0.25 mg by mouth daily. (Patient not  taking: Reported on 01/21/2022)     atorvastatin (LIPITOR) 10 MG tablet Take 1 tablet (10 mg total) by mouth daily. 90 tablet 3   Calcium Citrate-Vitamin D (CALCIUM CITRATE + D PO) Take by mouth.     escitalopram (LEXAPRO) 20 MG tablet Take 1 tablet (20 mg total) by mouth daily. 90 tablet 1   hydrOXYzine (ATARAX) 25 MG tablet Take one capsule up to four times a day for anxiety/sleep. 120 tablet 5   LUTEIN PO Take by mouth.     Current Facility-Administered Medications  Medication Dose Route Frequency Provider Last Rate Last Admin   0.9 %  sodium chloride infusion  500 mL Intravenous Once Mansouraty, Telford Nab., MD        Medication Side Effects: None  Allergies: No Known Allergies  Past Medical History:  Diagnosis Date   Depression    Hyperlipidemia    Sciatica    Tobacco use disorder, continuous     Past Medical History, Surgical history, Social history, and Family history were reviewed and updated as appropriate.   Please see review of systems for further details on the patient's review from today.   Objective:   Physical Exam:  There were no vitals taken for this visit.  Physical Exam Constitutional:      General: She is not in acute distress. Musculoskeletal:        General: No deformity.  Neurological:     Mental Status: She is alert and oriented to person, place, and time.     Coordination: Coordination  normal.  Psychiatric:        Attention and Perception: Attention and perception normal. She does not perceive auditory or visual hallucinations.        Mood and Affect: Mood normal. Mood is not anxious or depressed. Affect is not labile, blunt, angry or inappropriate.        Speech: Speech normal.        Behavior: Behavior normal.        Thought Content: Thought content normal. Thought content is not paranoid or delusional. Thought content does not include homicidal or suicidal ideation. Thought content does not include homicidal or suicidal plan.        Cognition  and Memory: Cognition and memory normal.        Judgment: Judgment normal.     Comments: Insight intact     Lab Review:     Component Value Date/Time   NA 135 11/14/2021 0958   K 4.2 11/14/2021 0958   CL 100 11/14/2021 0958   CO2 30 11/14/2021 0958   GLUCOSE 86 11/14/2021 0958   BUN 13 11/14/2021 0958   CREATININE 0.80 11/14/2021 0958   CALCIUM 9.2 11/14/2021 0958   PROT 7.0 11/14/2021 0958   ALBUMIN 4.1 11/14/2021 0958   AST 26 11/14/2021 0958   ALT 15 11/14/2021 0958   ALKPHOS 79 11/14/2021 0958   BILITOT 0.5 11/14/2021 0958       Component Value Date/Time   WBC 7.9 11/14/2021 0958   RBC 4.54 11/14/2021 0958   HGB 13.6 11/14/2021 0958   HCT 41.1 11/14/2021 0958   PLT 284.0 11/14/2021 0958   MCV 90.6 11/14/2021 0958   MCHC 33.2 11/14/2021 0958   RDW 14.3 11/14/2021 0958   LYMPHSABS 2.0 11/14/2021 0958   MONOABS 0.7 11/14/2021 0958   EOSABS 0.1 11/14/2021 0958   BASOSABS 0.1 11/14/2021 0958    No results found for: "POCLITH", "LITHIUM"   No results found for: "PHENYTOIN", "PHENOBARB", "VALPROATE", "CBMZ"   .res Assessment: Plan:    Plan:  1. Abilify '2mg'$  daily 2. Hydroxyzine 25 - 4 daily prn anxiety/insomnia 3. Lexapro '20mg'$  daily   RTC 6 months  Patient advised to contact office with any questions, adverse effects, or acute worsening in signs and symptoms.  Discussed potential metabolic side effects associated with atypical antipsychotics, as well as potential risk for movement side effects. Advised pt to contact office if movement side effects occur.   Diagnoses and all orders for this visit:  Major depressive disorder, recurrent episode, moderate (HCC) -     ARIPiprazole (ABILIFY) 2 MG tablet; TAKE 1 TABLET(2 MG) BY MOUTH DAILY -     escitalopram (LEXAPRO) 20 MG tablet; Take 1 tablet (20 mg total) by mouth daily.  Generalized anxiety disorder -     ARIPiprazole (ABILIFY) 2 MG tablet; TAKE 1 TABLET(2 MG) BY MOUTH DAILY -     escitalopram (LEXAPRO)  20 MG tablet; Take 1 tablet (20 mg total) by mouth daily.  Insomnia, unspecified type -     hydrOXYzine (ATARAX) 25 MG tablet; Take one capsule up to four times a day for anxiety/sleep.     Please see After Visit Summary for patient specific instructions.  Future Appointments  Date Time Provider [place]  01/27/2023  3:15 PM [facility] HEALTH ADVISOR 2 [facility]    No orders of the defined types were placed in this encounter.   -------------------------------

## 2022-08-15 ENCOUNTER — Other Ambulatory Visit: Payer: Self-pay | Admitting: Family Medicine

## 2022-08-15 DIAGNOSIS — E782 Mixed hyperlipidemia: Secondary | ICD-10-CM

## 2022-09-10 DIAGNOSIS — D2272 Melanocytic nevi of left lower limb, including hip: Secondary | ICD-10-CM | POA: Diagnosis not present

## 2022-09-10 DIAGNOSIS — Z85828 Personal history of other malignant neoplasm of skin: Secondary | ICD-10-CM | POA: Diagnosis not present

## 2022-09-10 DIAGNOSIS — D2262 Melanocytic nevi of left upper limb, including shoulder: Secondary | ICD-10-CM | POA: Diagnosis not present

## 2022-09-10 DIAGNOSIS — L821 Other seborrheic keratosis: Secondary | ICD-10-CM | POA: Diagnosis not present

## 2022-10-19 ENCOUNTER — Other Ambulatory Visit: Payer: Self-pay | Admitting: Adult Health

## 2022-10-19 DIAGNOSIS — F411 Generalized anxiety disorder: Secondary | ICD-10-CM

## 2022-10-19 DIAGNOSIS — F331 Major depressive disorder, recurrent, moderate: Secondary | ICD-10-CM

## 2022-10-21 DIAGNOSIS — K080 Exfoliation of teeth due to systemic causes: Secondary | ICD-10-CM | POA: Diagnosis not present

## 2022-10-30 ENCOUNTER — Ambulatory Visit: Payer: Medicare Other | Admitting: Adult Health

## 2022-11-10 IMAGING — MG MM DIGITAL SCREENING BILAT W/ TOMO AND CAD
6 of 10 series · 6 of 30 positions shown · non-contrast
Comparison: None.

CLINICAL DATA: Screening.

EXAM:
DIGITAL SCREENING BILATERAL MAMMOGRAM WITH TOMOSYNTHESIS AND CAD
TECHNIQUE: Bilateral screening digital craniocaudal and mediolateral oblique
mammograms were obtained. Bilateral screening digital breast
tomosynthesis was performed. The images were evaluated with
computer-aided detection.

[R MLO synth-2D]
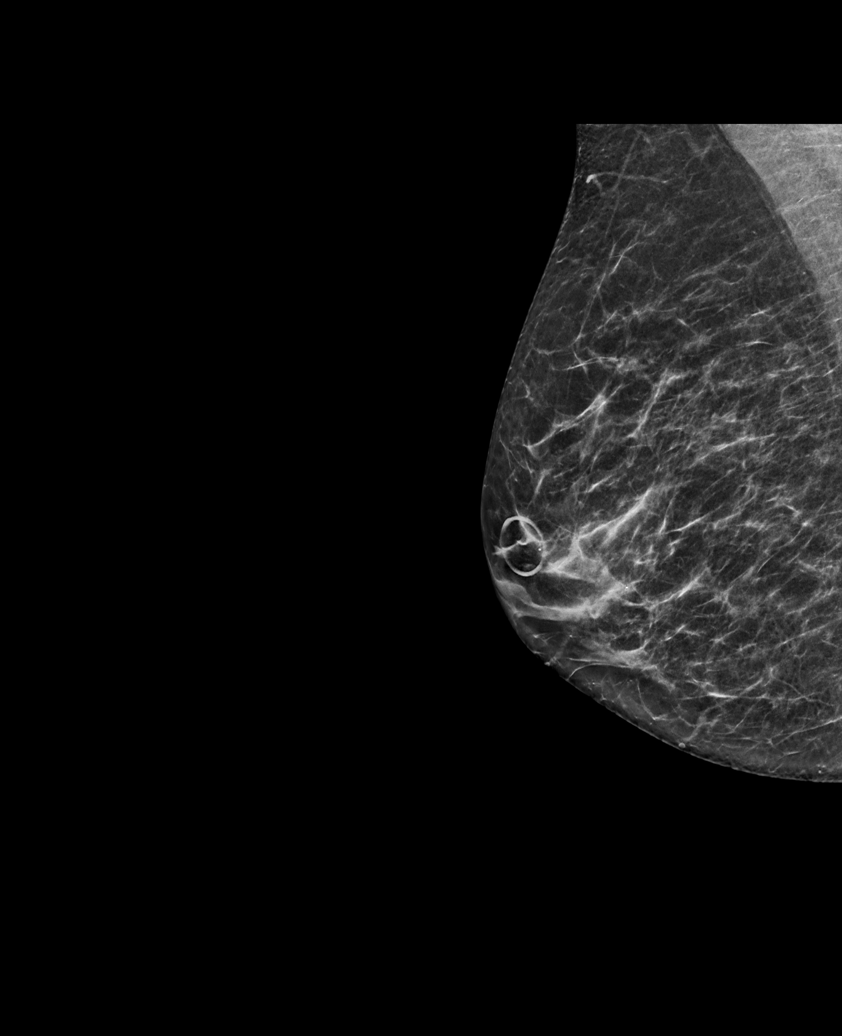

[L MLO synth-2D]
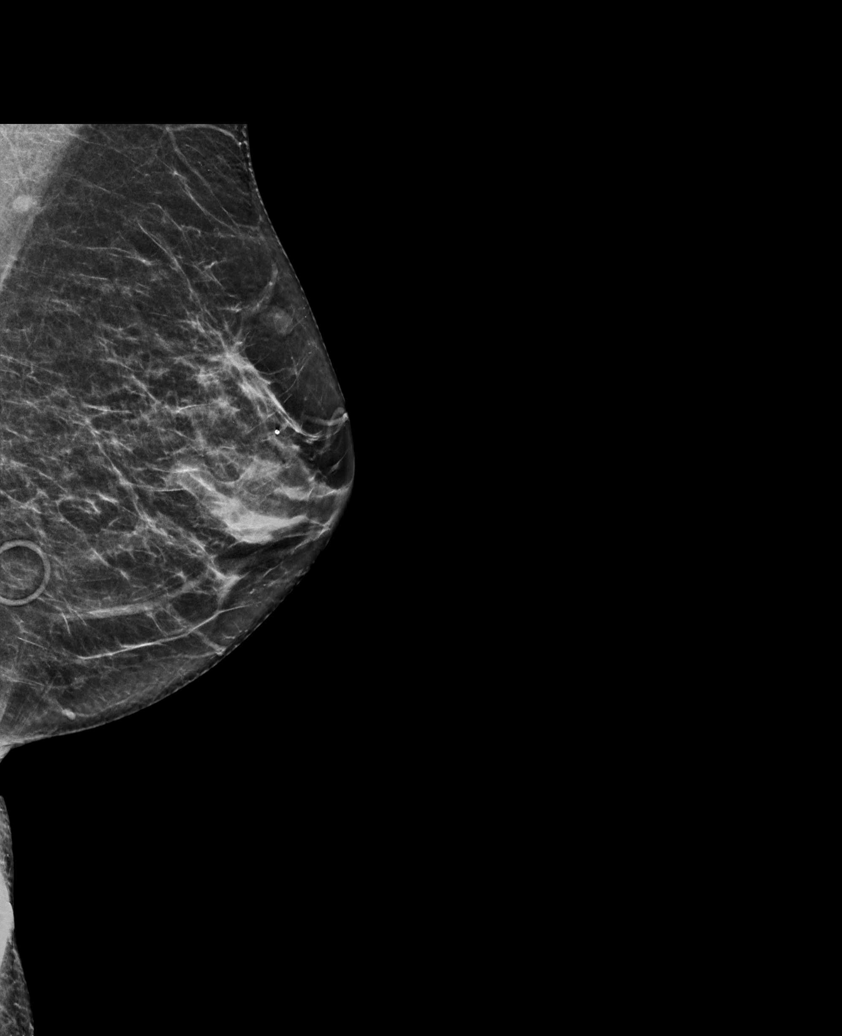

[R CC synth-2D]
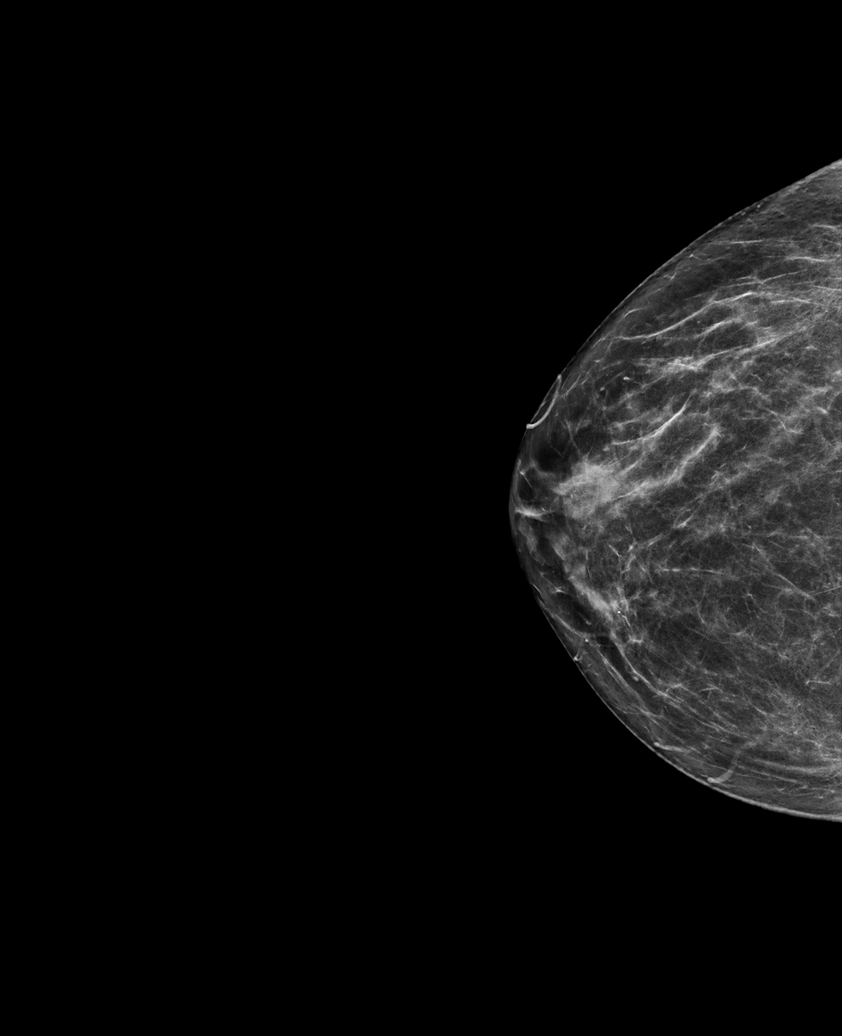

[L CC synth-2D (1 of 2)]
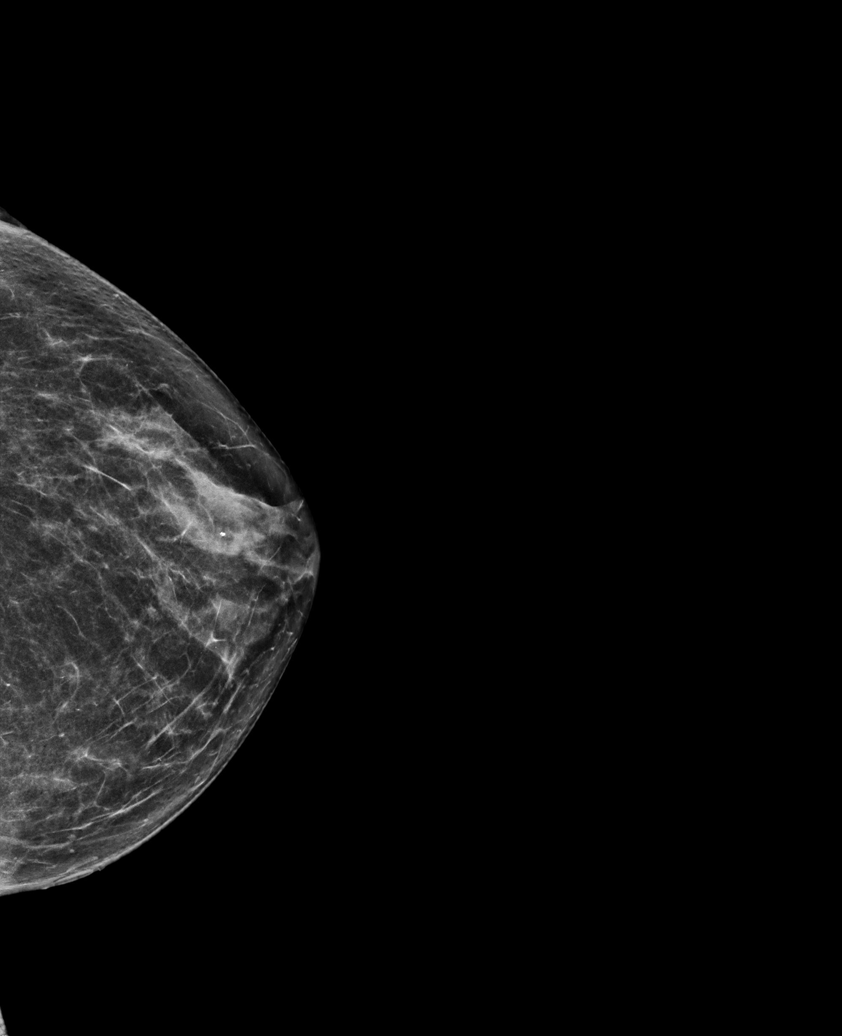

[L CC synth-2D (2 of 2)]
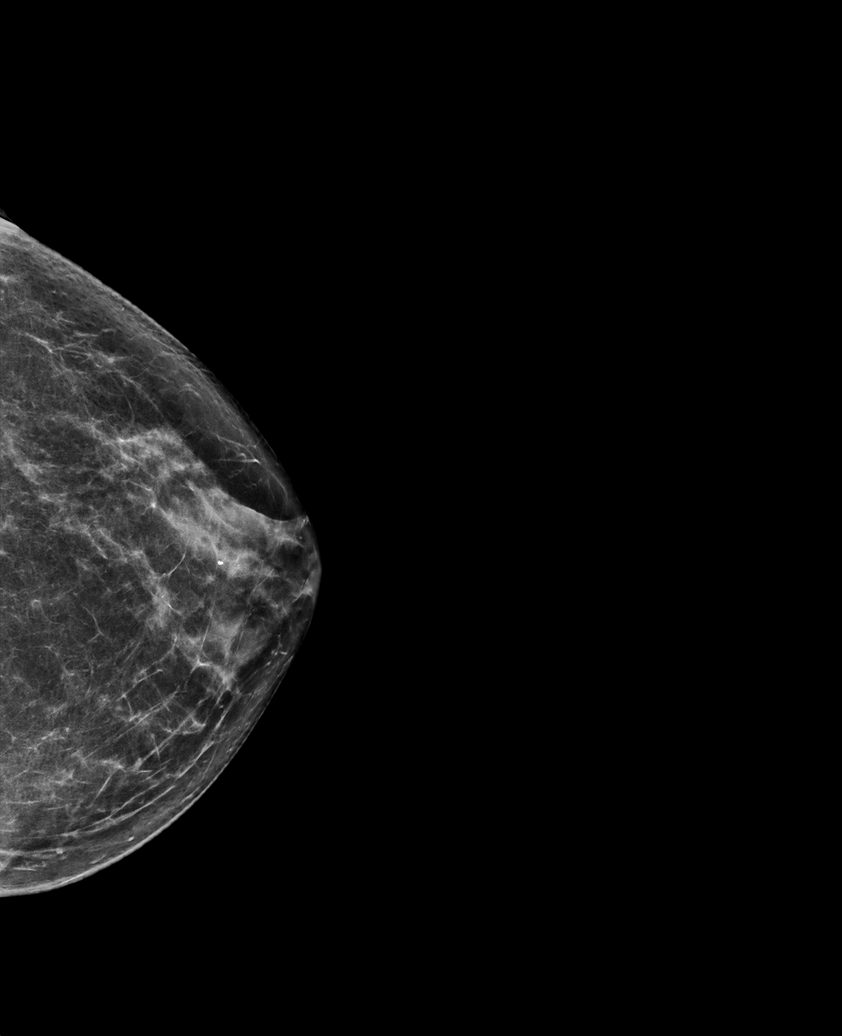

[R MLO tomo · tomo slice 31/60.0]
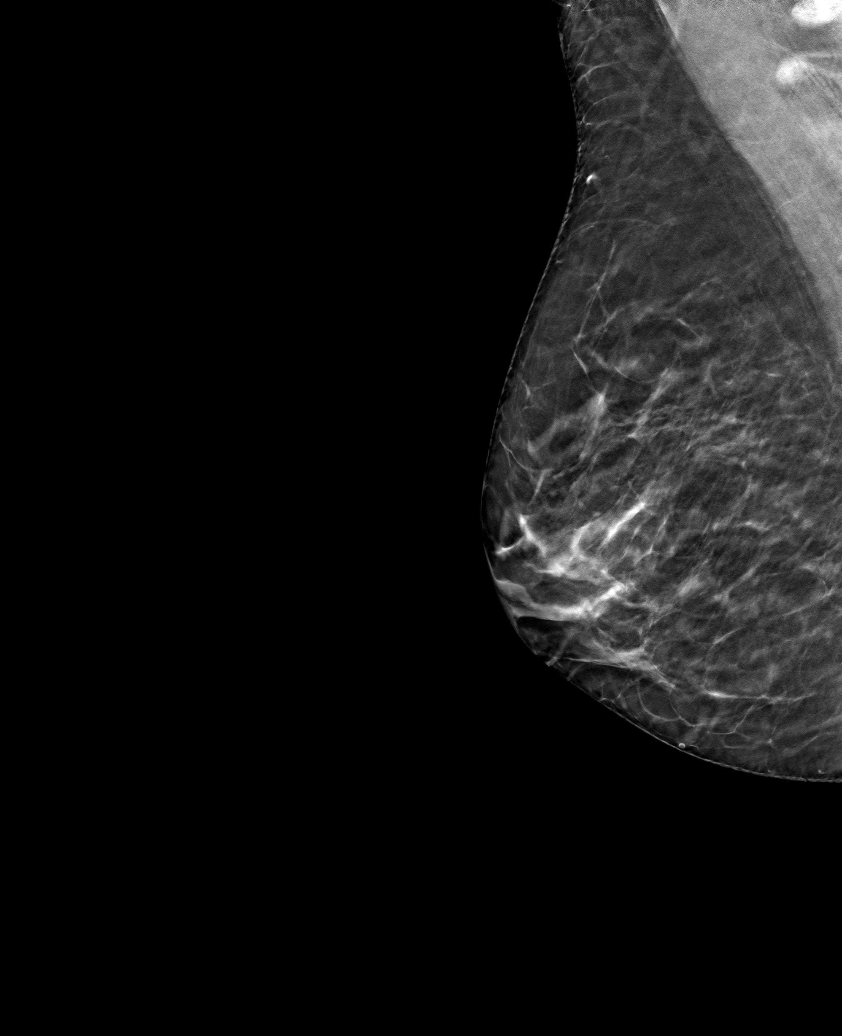

[6 of 30 positions shown; findings below may reference images not displayed]

ACR Breast Density Category c: The breast tissue is heterogeneously
dense, which may obscure small masses.
FINDINGS: In the left breast, a possible mass warrants further evaluation. In
the right breast, no findings suspicious for malignancy.
IMPRESSION: Further evaluation is suggested for a possible mass in the left
breast.

RECOMMENDATION:
Diagnostic mammogram and possibly ultrasound of the left breast.
(Code:O7-4-DDG)

The patient will be contacted regarding the findings, and additional
imaging will be scheduled.

BI-RADS CATEGORY  0: Incomplete. Need additional imaging evaluation
and/or prior mammograms for comparison.

## 2022-11-12 ENCOUNTER — Encounter: Payer: Self-pay | Admitting: Gastroenterology

## 2022-11-13 ENCOUNTER — Encounter: Payer: Self-pay | Admitting: Adult Health

## 2022-11-13 ENCOUNTER — Ambulatory Visit: Payer: Medicare Other | Admitting: Adult Health

## 2022-11-13 DIAGNOSIS — F411 Generalized anxiety disorder: Secondary | ICD-10-CM

## 2022-11-13 DIAGNOSIS — F331 Major depressive disorder, recurrent, moderate: Secondary | ICD-10-CM | POA: Diagnosis not present

## 2022-11-13 DIAGNOSIS — G4700 Insomnia, unspecified: Secondary | ICD-10-CM | POA: Diagnosis not present

## 2022-11-13 MED ORDER — ARIPIPRAZOLE 2 MG PO TABS: ORAL_TABLET | ORAL | 3 refills | Status: DC

## 2022-11-13 MED ORDER — ESCITALOPRAM OXALATE 20 MG PO TABS: ORAL_TABLET | ORAL | 3 refills | Status: DC

## 2022-11-13 MED ORDER — HYDROXYZINE HCL 25 MG PO TABS: ORAL_TABLET | ORAL | 5 refills | Status: DC

## 2022-11-13 NOTE — Progress Notes (Signed)
[name] [id number] Dec 18, 1950 72 y.o.  Subjective:   Patient ID:  [name] is a 72 y.o. (DOB [birth year]) female.  Chief Complaint: No chief complaint on file.   HPI [name] presents to the office today for follow-up of anxiety, depression, and insomnia.  Describes mood today as "ok". Pleasant. Denies tearfulness. Mood symptoms - denies depression, anxiety and irritability. Mood is consistent. Stating I'm doing real good". Feels like current medications continue to work well. She and husband doing well. Stable interest and motivation. Taking medications as prescribed.  Energy levels stable. Active, does not have a regular exercise routine.  Enjoys some usual interests and activities. Married. Lives with husband - 2 grown sons - one in [place] and one in [place].  Appetite adequate. Weight stable - 135 pounds. Sleeps well most nights. Averages 8 to 9 hours. Focus and concentration stable. Completing tasks. Managing aspects of household. Works part time - 12 hours. Denies SI or HI.  Denies AH or VH. Denies self harm. Denies substance use.  PHQ2-9    Flowsheet Row Clinical Support from 01/21/2022 in [facility] [place] Office Visit from 11/14/2021 in [facility] [place] [facility] [place] Clinical Support from 01/08/2021 in [facility] [facility] [place] Office Visit from 11/03/2019 in [facility] [place] Office Visit from 05/26/2017 in [facility] at [place]  PHQ-2 Total Score 0 0 0 0 2  PHQ-9 Total Score -- 0 -- -- --        Review of Systems:  Review of Systems  Musculoskeletal:  Negative for gait problem.  Neurological:  Negative for tremors.  Psychiatric/Behavioral:         Please refer to HPI    Medications: I have reviewed the patient's current medications.  Current Outpatient Medications  Medication Sig Dispense Refill   ARIPiprazole (ABILIFY) 2 MG tablet TAKE 1  TABLET(2 MG) BY MOUTH DAILY 90 tablet 3   atorvastatin (LIPITOR) 10 MG tablet TAKE 1 TABLET(10 MG) BY MOUTH DAILY 90 tablet 3   Calcium Citrate-Vitamin D (CALCIUM CITRATE + D PO) Take by mouth.     escitalopram (LEXAPRO) 20 MG tablet TAKE 1 TABLET(20 MG) BY MOUTH DAILY 90 tablet 3   hydrOXYzine (ATARAX) 25 MG tablet Take one capsule up to four times a day for anxiety/sleep. 120 tablet 5   LUTEIN PO Take by mouth.     No current facility-administered medications for this visit.    Medication Side Effects: None  Allergies: No Known Allergies  Past Medical History:  Diagnosis Date   Depression    Hyperlipidemia    Sciatica    Tobacco use disorder, continuous     Past Medical History, Surgical history, Social history, and Family history were reviewed and updated as appropriate.   Please see review of systems for further details on the patient's review from today.   Objective:   Physical Exam:  There were no vitals taken for this visit.  Physical Exam Constitutional:      General: She is not in acute distress. Musculoskeletal:        General: No deformity.  Neurological:     Mental Status: She is alert and oriented to person, place, and time.     Coordination: Coordination normal.  Psychiatric:        Attention and Perception: Attention and perception normal. She does not perceive auditory or visual hallucinations.        Mood and  Affect: Mood normal. Mood is not anxious or depressed. Affect is not labile, blunt, angry or inappropriate.        Speech: Speech normal.        Behavior: Behavior normal.        Thought Content: Thought content normal. Thought content is not paranoid or delusional. Thought content does not include homicidal or suicidal ideation. Thought content does not include homicidal or suicidal plan.        Cognition and Memory: Cognition and memory normal.        Judgment: Judgment normal.     Comments: Insight intact     Lab Review:     Component  Value Date/Time   NA 135 11/14/2021 0958   K 4.2 11/14/2021 0958   CL 100 11/14/2021 0958   CO2 30 11/14/2021 0958   GLUCOSE 86 11/14/2021 0958   BUN 13 11/14/2021 0958   CREATININE 0.80 11/14/2021 0958   CALCIUM 9.2 11/14/2021 0958   PROT 7.0 11/14/2021 0958   ALBUMIN 4.1 11/14/2021 0958   AST 26 11/14/2021 0958   ALT 15 11/14/2021 0958   ALKPHOS 79 11/14/2021 0958   BILITOT 0.5 11/14/2021 0958       Component Value Date/Time   WBC 7.9 11/14/2021 0958   RBC 4.54 11/14/2021 0958   HGB 13.6 11/14/2021 0958   HCT 41.1 11/14/2021 0958   PLT 284.0 11/14/2021 0958   MCV 90.6 11/14/2021 0958   MCHC 33.2 11/14/2021 0958   RDW 14.3 11/14/2021 0958   LYMPHSABS 2.0 11/14/2021 0958   MONOABS 0.7 11/14/2021 0958   EOSABS 0.1 11/14/2021 0958   BASOSABS 0.1 11/14/2021 0958    No results found for: "POCLITH", "LITHIUM"   No results found for: "PHENYTOIN", "PHENOBARB", "VALPROATE", "CBMZ"   .res Assessment: Plan:    Plan:  1. Abilify 2mg  daily 2. Hydroxyzine 25 - 4 daily prn anxiety/insomnia 3. Lexapro 20mg  daily   RTC 6 months  Patient advised to contact office with any questions, adverse effects, or acute worsening in signs and symptoms.  Discussed potential metabolic side effects associated with atypical antipsychotics, as well as potential risk for movement side effects. Advised pt to contact office if movement side effects occur.   Diagnoses and all orders for this visit:  Major depressive disorder, recurrent episode, moderate -     escitalopram (LEXAPRO) 20 MG tablet; TAKE 1 TABLET(20 MG) BY MOUTH DAILY -     ARIPiprazole (ABILIFY) 2 MG tablet; TAKE 1 TABLET(2 MG) BY MOUTH DAILY  Generalized anxiety disorder -     escitalopram (LEXAPRO) 20 MG tablet; TAKE 1 TABLET(20 MG) BY MOUTH DAILY -     ARIPiprazole (ABILIFY) 2 MG tablet; TAKE 1 TABLET(2 MG) BY MOUTH DAILY  Insomnia, unspecified type -     hydrOXYzine (ATARAX) 25 MG tablet; Take one capsule up to four times a  day for anxiety/sleep.     Please see After Visit Summary for patient specific instructions.  Future Appointments  Date Time Provider Department Center  01/27/2023  3:15 PM [facility] HEALTH ADVISOR 2 [facility]    No orders of the defined types were placed in this encounter.   -------------------------------

## 2023-01-30 ENCOUNTER — Ambulatory Visit (INDEPENDENT_AMBULATORY_CARE_PROVIDER_SITE_OTHER): Payer: Medicare Other

## 2023-01-30 VITALS — BP 120/60 | HR 66 | Temp 98.9°F | Ht 63.0 in | Wt 143.1 lb

## 2023-01-30 DIAGNOSIS — Z0000 Encounter for general adult medical examination without abnormal findings: Secondary | ICD-10-CM | POA: Diagnosis not present

## 2023-01-30 NOTE — Progress Notes (Signed)
Subjective:   [name] is a 72 y.o. female who presents for Medicare Annual (Subsequent) preventive examination.  Visit Complete: In person  Patient Medicare AWV questionnaire was completed by the patient on 01/24/23; I have confirmed that all information answered by patient is correct and no changes since this date.  Review of Systems      Cardiac Risk Factors include: advanced age (>12men, >25 women);smoking/ tobacco exposure     Objective:    Today's Vitals   01/30/23 1459  BP: 120/60  Pulse: 66  Temp: 98.9 F (37.2 C)  TempSrc: Oral  SpO2: 97%  Weight: 143 lb 1.6 oz (64.9 kg)  Height: 5\' 3"  (1.6 m)   Body mass index is 25.35 kg/m.     01/30/2023    3:14 PM 01/21/2022    3:15 PM 01/08/2021    3:26 PM  Advanced Directives  Does Patient Have a Medical Advance Directive? Yes No No  Type of Estate agent of [place];Living will    Copy of Healthcare Power of Attorney in Chart? No - copy requested    Would patient like information on creating a medical advance directive?  Yes (MAU/Ambulatory/Procedural Areas - Information given) No - Patient declined    Current Medications (verified) Outpatient Encounter Medications as of 01/30/2023  Medication Sig   ARIPiprazole (ABILIFY) 2 MG tablet TAKE 1 TABLET(2 MG) BY MOUTH DAILY   atorvastatin (LIPITOR) 10 MG tablet TAKE 1 TABLET(10 MG) BY MOUTH DAILY   Calcium Citrate-Vitamin D (CALCIUM CITRATE + D PO) Take by mouth.   escitalopram (LEXAPRO) 20 MG tablet TAKE 1 TABLET(20 MG) BY MOUTH DAILY   hydrOXYzine (ATARAX) 25 MG tablet Take one capsule up to four times a day for anxiety/sleep.   LUTEIN PO Take by mouth.   No facility-administered encounter medications on file as of 01/30/2023.    Allergies (verified) Patient has no known allergies.   History: Past Medical History:  Diagnosis Date   Depression    Hyperlipidemia    Sciatica    Tobacco use disorder, continuous    Past Surgical History:   Procedure Laterality Date   CESAREAN SECTION     fractured heel bone     Family History  Problem Relation Age of Onset   Colon cancer Neg Hx    Colon polyps Neg Hx    Esophageal cancer Neg Hx    Stomach cancer Neg Hx    Rectal cancer Neg Hx    Breast cancer Neg Hx    Social History   Socioeconomic History   Marital status: Married    Spouse name: Not on file   Number of children: Not on file   Years of education: Not on file   Highest education level: Not on file  Occupational History   Not on file  Tobacco Use   Smoking status: Every Day    Packs/day: 1    Types: Cigarettes   Smokeless tobacco: Never  Vaping Use   Vaping Use: Never used  Substance and Sexual Activity   Alcohol use: No   Drug use: No   Sexual activity: Not on file  Other Topics Concern   Not on file  Social History Narrative   Not on file   Social Determinants of Health   Financial Resource Strain: Low Risk  (01/30/2023)   Overall Financial Resource Strain (CARDIA)    Difficulty of Paying Living Expenses: Not hard at all  Food Insecurity: No Food Insecurity (01/30/2023)  Hunger Vital Sign    Worried About Running Out of Food in the Last Year: Never true    Ran Out of Food in the Last Year: Never true  Transportation Needs: No Transportation Needs (01/30/2023)   PRAPARE - Administrator, Civil Service (Medical): No    Lack of Transportation (Non-Medical): No  Physical Activity: Insufficiently Active (01/30/2023)   Exercise Vital Sign    Days of Exercise per Week: 2 days    Minutes of Exercise per Session: 40 min  Stress: No Stress Concern Present (01/30/2023)   [employer] of Occupational Health - Occupational Stress Questionnaire    Feeling of Stress : Not at all  Social Connections: Moderately Integrated (01/30/2023)   Social Connection and Isolation Panel [NHANES]    Frequency of Communication with Friends and Family: More than three times a week    Frequency of Social  Gatherings with Friends and Family: More than three times a week    Attends Religious Services: Never    Database administrator or Organizations: Yes    Attends Engineer, structural: More than 4 times per year    Marital Status: Married    Tobacco Counseling Ready to quit: No Counseling given: Yes   Clinical Intake:  Pre-visit preparation completed: Yes  Pain : No/denies pain     BMI - recorded: 25.35 Nutritional Status: BMI 25 -29 Overweight Nutritional Risks: None Diabetes: No  How often do you need to have someone help you when you read instructions, pamphlets, or other written materials from your doctor or pharmacy?: 1 - Never  Interpreter Needed?: No  Information entered by :: Theresa Mulligan LPN   Activities of Daily Living    01/30/2023    3:13 PM 01/24/2023    9:31 AM  In your present state of health, do you have any difficulty performing the following activities:  Hearing? 0 0  Vision? 0 0  Difficulty concentrating or making decisions? 0 0  Walking or climbing stairs? 0 0  Dressing or bathing? 0 0  Doing errands, shopping? 0 0  Preparing Food and eating ? N N  Using the Toilet? N N  In the past six months, have you accidently leaked urine? N Y  Do you have problems with loss of bowel control? N N  Managing your Medications? N N  Managing your Finances? N N  Housekeeping or managing your Housekeeping? N N    Patient Care Team: Deeann Saint, MD as PCP - General (Family Medicine)  Indicate any recent Medical Services you may have received from other than [facility] providers in the past year (date may be approximate).     Assessment:   This is a routine wellness examination for [name].  Hearing/Vision screen Hearing Screening - Comments:: Denies hearing difficulties   Vision Screening - Comments:: Wears rx glasses - up to date with routine eye exams with  Dr Cathey Endow  Dietary issues and exercise activities discussed:     Goals Addressed                This Visit's Progress     Patient Stated (pt-stated)        Lose weight.       Depression Screen    01/30/2023    2:59 PM 01/21/2022    3:17 PM 11/14/2021    9:42 AM 01/08/2021    3:25 PM 11/03/2019    3:00 PM 05/26/2017   12:48 PM  PHQ 2/9  Scores  PHQ - 2 Score 0 0 0 0 0 2  PHQ- 9 Score   0       Fall Risk    01/30/2023    3:14 PM 01/24/2023    9:31 AM 01/21/2022    3:17 PM 11/14/2021    9:42 AM 01/08/2021    3:26 PM  Fall Risk   Falls in the past year? 0 0 0 0 0  Number falls in past yr: 0 0 0 0 0  Injury with Fall? 0 0 0 0 0  Risk for fall due to : No Fall Risks  Medication side effect No Fall Risks Impaired vision  Follow up Falls prevention discussed  Falls evaluation completed;Education provided;Falls prevention discussed Falls evaluation completed Falls prevention discussed    MEDICARE RISK AT HOME:  Medicare Risk at Home - 01/30/23 1517     Any stairs in or around the home? Yes    If so, are there any without handrails? No    Home free of loose throw rugs in walkways, pet beds, electrical cords, etc? Yes    Adequate lighting in your home to reduce risk of falls? Yes    Life alert? No    Use of a cane, walker or w/c? No    Grab bars in the bathroom? No    Shower chair or bench in shower? No    Elevated toilet seat or a handicapped toilet? No             TIMED UP AND GO:  Was the test performed?  Yes  Length of time to ambulate 10 feet: 10 sec Gait steady and fast without use of assistive device    Cognitive Function:        01/30/2023    3:15 PM 01/21/2022    3:19 PM 01/08/2021    3:27 PM  6CIT Screen  What Year? 0 points 0 points 0 points  What month? 0 points 0 points 0 points  What time? 0 points 0 points 0 points  Count back from 20 0 points 0 points 0 points  Months in reverse 0 points 0 points 0 points  Repeat phrase 0 points 0 points 0 points  Total Score 0 points 0 points 0 points    Immunizations Immunization History   Administered Date(s) Administered   Influenza Split 05/18/2013, 05/19/2013   Influenza, High Dose Seasonal PF 05/26/2017, 05/27/2018, 04/23/2019, 05/23/2021   Influenza, Seasonal, Injecte, Preservative Fre 05/18/2015   Influenza-Unspecified 05/17/2020   PFIZER(Purple Top)SARS-COV-2 Vaccination 09/10/2019, 10/01/2019, 05/17/2020   Pfizer Covid-19 Vaccine Bivalent Booster 55yrs & up 05/23/2021   Pneumococcal Conjugate-13 05/26/2017   Pneumococcal Polysaccharide-23 05/27/2018   Tdap 10/20/2019    TDAP status: Up to date    Pneumococcal vaccine status: Up to date  Covid-19 vaccine status: Completed vaccines  Qualifies for Shingles Vaccine? Yes   Zostavax completed No   Shingrix Completed?: No.    Education has been provided regarding the importance of this vaccine. Patient has been advised to call insurance company to determine out of pocket expense if they have not yet received this vaccine. Advised may also receive vaccine at local pharmacy or Health Dept. Verbalized acceptance and understanding.  Screening Tests Health Maintenance  Topic Date Due   COVID-19 Vaccine (5 - 2023-24 season) 02/15/2023 (Originally 04/04/2022)   Zoster Vaccines- Shingrix (1 of 2) 05/02/2023 (Originally 03/27/2002)   Colonoscopy  01/30/2024 (Originally 10/27/2022)   INFLUENZA VACCINE  03/05/2023   MAMMOGRAM  03/06/2023   Medicare Annual Wellness (AWV)  01/30/2024   DTaP/Tdap/Td (2 - Td or Tdap) 10/19/2029   Pneumonia Vaccine 40+ Years old  Completed   DEXA SCAN  Completed   Hepatitis C Screening  Completed   HPV VACCINES  Aged Out    Health Maintenance  There are no preventive care reminders to display for this patient.   Colorectal cancer screening: Referral to GI placed Deferred. Pt aware the office will call re: appt.  Mammogram status: Completed 03/05/21. Repeat every year  Bone Density status: Completed 01/14/21. Results reflect: Bone density results: OSTEOPOROSIS. Repeat every    years.  Lung Cancer Screening: (Low Dose CT Chest recommended if Age 24-80 years, 20 pack-year currently smoking OR have quit w/in 15years.) does qualify.   Lung Cancer Screening Referral: Deferred  Additional Screening:  Hepatitis C Screening: does qualify; Completed 10/20/19  Vision Screening: Recommended annual ophthalmology exams for early detection of glaucoma and other disorders of the eye. Is the patient up to date with their annual eye exam?  Yes  Who is the provider or what is the name of the office in which the patient attends annual eye exams? Dr Cathey Endow If pt is not established with a provider, would they like to be referred to a provider to establish care? No .   Dental Screening: Recommended annual dental exams for proper oral hygiene    Community Resource Referral / Chronic Care Management:  CRR required this visit?  No   CCM required this visit?  No     Plan:     I have personally reviewed and noted the following in the patient's chart:   Medical and social history Use of alcohol, tobacco or illicit drugs  Current medications and supplements including opioid prescriptions. Patient is not currently taking opioid prescriptions. Functional ability and status Nutritional status Physical activity Advanced directives List of other physicians Hospitalizations, surgeries, and ER visits in previous 12 months Vitals Screenings to include cognitive, depression, and falls Referrals and appointments  In addition, I have reviewed and discussed with patient certain preventive protocols, quality metrics, and best practice recommendations. A written personalized care plan for preventive services as well as general preventive health recommendations were provided to patient.     Tillie Rung, LPN   1/61/0960   After Visit Summary: Given  Nurse Notes: None

## 2023-01-30 NOTE — Patient Instructions (Addendum)
[name] , Thank you for taking time to come for your Medicare Wellness Visit. I appreciate your ongoing commitment to your health goals. Please review the following plan we discussed and let me know if I can assist you in the future.   These are the goals we discussed:  Goals       Patient Stated (pt-stated)      Lose weight.      Patient Stated      01/21/2022, wants to lose a pound or two        This is a list of the screening recommended for you and due dates:  Health Maintenance  Topic Date Due   COVID-19 Vaccine (5 - 2023-24 season) 02/15/2023*   Zoster (Shingles) Vaccine (1 of 2) 05/02/2023*   Colon Cancer Screening  01/30/2024*   Flu Shot  03/05/2023   Mammogram  03/06/2023   Medicare Annual Wellness Visit  01/30/2024   DTaP/Tdap/Td vaccine (2 - Td or Tdap) 10/19/2029   Pneumonia Vaccine  Completed   DEXA scan (bone density measurement)  Completed   Hepatitis C Screening  Completed   HPV Vaccine  Aged Out  *Topic was postponed. The date shown is not the original due date.    Advanced directives: Please bring a copy of your health care power of attorney and living will to the office to be added to your chart at your convenience.   Conditions/risks identified: None  Next appointment: Follow up in one year for your annual wellness visit    Preventive Care 65 Years and Older, Female Preventive care refers to lifestyle choices and visits with your health care provider that can promote health and wellness. What does preventive care include? A yearly physical exam. This is also called an annual well check. Dental exams once or twice a year. Routine eye exams. Ask your health care provider how often you should have your eyes checked. Personal lifestyle choices, including: Daily care of your teeth and gums. Regular physical activity. Eating a healthy diet. Avoiding tobacco and drug use. Limiting alcohol use. Practicing safe sex. Taking low-dose aspirin every  day. Taking vitamin and mineral supplements as recommended by your health care provider. What happens during an annual well check? The services and screenings done by your health care provider during your annual well check will depend on your age, overall health, lifestyle risk factors, and family history of disease. Counseling  Your health care provider may ask you questions about your: Alcohol use. Tobacco use. Drug use. Emotional well-being. Home and relationship well-being. Sexual activity. Eating habits. History of falls. Memory and ability to understand (cognition). Work and work Astronomer. Reproductive health. Screening  You may have the following tests or measurements: Height, weight, and BMI. Blood pressure. Lipid and cholesterol levels. These may be checked every 5 years, or more frequently if you are over 45 years old. Skin check. Lung cancer screening. You may have this screening every year starting at age 49 if you have a 30-pack-year history of smoking and currently smoke or have quit within the past 15 years. Fecal occult blood test (FOBT) of the stool. You may have this test every year starting at age 9. Flexible sigmoidoscopy or colonoscopy. You may have a sigmoidoscopy every 5 years or a colonoscopy every 10 years starting at age 77. Hepatitis C blood test. Hepatitis B blood test. Sexually transmitted disease (STD) testing. Diabetes screening. This is done by checking your blood sugar (glucose) after you have not eaten for  a while (fasting). You may have this done every 1-3 years. Bone density scan. This is done to screen for osteoporosis. You may have this done starting at age 72. Mammogram. This may be done every 1-2 years. Talk to your health care provider about how often you should have regular mammograms. Talk with your health care provider about your test results, treatment options, and if necessary, the need for more tests. Vaccines  Your health care  provider may recommend certain vaccines, such as: Influenza vaccine. This is recommended every year. Tetanus, diphtheria, and acellular pertussis (Tdap, Td) vaccine. You may need a Td booster every 10 years. Zoster vaccine. You may need this after age 35. Pneumococcal 13-valent conjugate (PCV13) vaccine. One dose is recommended after age 5. Pneumococcal polysaccharide (PPSV23) vaccine. One dose is recommended after age 60. Talk to your health care provider about which screenings and vaccines you need and how often you need them. This information is not intended to replace advice given to you by your health care provider. Make sure you discuss any questions you have with your health care provider. Document Released: 08/17/2015 Document Revised: 04/09/2016 Document Reviewed: 05/22/2015 Elsevier Interactive Patient Education  2017 [place] Prevention in the Home Falls can cause injuries. They can happen to people of all ages. There are many things you can do to make your home safe and to help prevent falls. What can I do on the outside of my home? Regularly fix the edges of walkways and driveways and fix any cracks. Remove anything that might make you trip as you walk through a door, such as a raised step or threshold. Trim any bushes or trees on the path to your home. Use bright outdoor lighting. Clear any walking paths of anything that might make someone trip, such as rocks or tools. Regularly check to see if handrails are loose or broken. Make sure that both sides of any steps have handrails. Any raised decks and porches should have guardrails on the edges. Have any leaves, snow, or ice cleared regularly. Use sand or salt on walking paths during winter. Clean up any spills in your garage right away. This includes oil or grease spills. What can I do in the bathroom? Use night lights. Install grab bars by the toilet and in the tub and shower. Do not use towel bars as grab  bars. Use non-skid mats or decals in the tub or shower. If you need to sit down in the shower, use a plastic, non-slip stool. Keep the floor dry. Clean up any water that spills on the floor as soon as it happens. Remove soap buildup in the tub or shower regularly. Attach bath mats securely with double-sided non-slip rug tape. Do not have throw rugs and other things on the floor that can make you trip. What can I do in the bedroom? Use night lights. Make sure that you have a light by your bed that is easy to reach. Do not use any sheets or blankets that are too big for your bed. They should not hang down onto the floor. Have a firm chair that has side arms. You can use this for support while you get dressed. Do not have throw rugs and other things on the floor that can make you trip. What can I do in the kitchen? Clean up any spills right away. Avoid walking on wet floors. Keep items that you use a lot in easy-to-reach places. If you need to reach something above  you, use a strong step stool that has a grab bar. Keep electrical cords out of the way. Do not use floor polish or wax that makes floors slippery. If you must use wax, use non-skid floor wax. Do not have throw rugs and other things on the floor that can make you trip. What can I do with my stairs? Do not leave any items on the stairs. Make sure that there are handrails on both sides of the stairs and use them. Fix handrails that are broken or loose. Make sure that handrails are as long as the stairways. Check any carpeting to make sure that it is firmly attached to the stairs. Fix any carpet that is loose or worn. Avoid having throw rugs at the top or bottom of the stairs. If you do have throw rugs, attach them to the floor with carpet tape. Make sure that you have a light switch at the top of the stairs and the bottom of the stairs. If you do not have them, ask someone to add them for you. What else can I do to help prevent  falls? Wear shoes that: Do not have high heels. Have rubber bottoms. Are comfortable and fit you well. Are closed at the toe. Do not wear sandals. If you use a stepladder: Make sure that it is fully opened. Do not climb a closed stepladder. Make sure that both sides of the stepladder are locked into place. Ask someone to hold it for you, if possible. Clearly mark and make sure that you can see: Any grab bars or handrails. First and last steps. Where the edge of each step is. Use tools that help you move around (mobility aids) if they are needed. These include: Canes. Walkers. Scooters. Crutches. Turn on the lights when you go into a dark area. Replace any light bulbs as soon as they burn out. Set up your furniture so you have a clear path. Avoid moving your furniture around. If any of your floors are uneven, fix them. If there are any pets around you, be aware of where they are. Review your medicines with your doctor. Some medicines can make you feel dizzy. This can increase your chance of falling. Ask your doctor what other things that you can do to help prevent falls. This information is not intended to replace advice given to you by your health care provider. Make sure you discuss any questions you have with your health care provider. Document Released: 05/17/2009 Document Revised: 12/27/2015 Document Reviewed: 08/25/2014 Elsevier Interactive Patient Education  2017 [employer].

## 2023-02-19 ENCOUNTER — Encounter: Payer: Self-pay | Admitting: Family Medicine

## 2023-02-19 ENCOUNTER — Ambulatory Visit (INDEPENDENT_AMBULATORY_CARE_PROVIDER_SITE_OTHER): Payer: Medicare Other | Admitting: Family Medicine

## 2023-02-19 VITALS — BP 138/86 | HR 73 | Temp 98.6°F | Ht 63.0 in | Wt 141.4 lb

## 2023-02-19 DIAGNOSIS — M85852 Other specified disorders of bone density and structure, left thigh: Secondary | ICD-10-CM | POA: Diagnosis not present

## 2023-02-19 DIAGNOSIS — E782 Mixed hyperlipidemia: Secondary | ICD-10-CM

## 2023-02-19 DIAGNOSIS — Z0000 Encounter for general adult medical examination without abnormal findings: Secondary | ICD-10-CM

## 2023-02-19 MED ORDER — ATORVASTATIN CALCIUM 10 MG PO TABS: 10.0000 mg | ORAL_TABLET | Freq: Every day | ORAL | 3 refills | Status: DC

## 2023-02-19 NOTE — Progress Notes (Signed)
Established Patient Office Visit   Subjective  Patient ID: [name], female    DOB: 1951/01/03  Age: 72 y.o. MRN: [id number]  Chief Complaint  Patient presents with   Medical Management of Chronic Issues    Patient is a 72 year old female who presents for CPE.  Patient states she is doing well overall.  States sleep, appetite, energy are good.  Taking Abilify and Lexapro.  Takes hydroxyzine in the evening prior to bed to help with falling asleep.  Patient requesting refill on Lipitor 10 mg daily.  Denies myalgias or arthralgias.  Patient hesitant to take bisphosphonates for osteoporosis.  Noted on bone density 01/14/2021.  AWV done 01/30/2023.    Past Medical History:  Diagnosis Date   Depression    Hyperlipidemia    Sciatica    Tobacco use disorder, continuous    Past Surgical History:  Procedure Laterality Date   CESAREAN SECTION     fractured heel bone     Social History   Tobacco Use   Smoking status: Every Day    Current packs/day: 1.00    Types: Cigarettes   Smokeless tobacco: Never  Vaping Use   Vaping status: Never Used  Substance Use Topics   Alcohol use: No   Drug use: No   Family History  Problem Relation Age of Onset   Colon cancer Neg Hx    Colon polyps Neg Hx    Esophageal cancer Neg Hx    Stomach cancer Neg Hx    Rectal cancer Neg Hx    Breast cancer Neg Hx    No Known Allergies    ROS Negative unless stated above    Objective:     BP 138/86 (BP Location: Left Arm, Patient Position: Sitting, Cuff Size: Normal)   Pulse 73   Temp 98.6 F (37 C) (Oral)   Ht 5\' 3"  (1.6 m)   Wt 141 lb 6.4 oz (64.1 kg)   SpO2 94%   BMI 25.05 kg/m    Physical Exam Constitutional:      Appearance: Normal appearance.  HENT:     Head: Normocephalic and atraumatic.     Right Ear: Tympanic membrane, ear canal and external ear normal.     Left Ear: Tympanic membrane, ear canal and external ear normal.     Nose: Nose normal.     Mouth/Throat:      Mouth: Mucous membranes are moist.     Pharynx: No oropharyngeal exudate or posterior oropharyngeal erythema.  Eyes:     General: No scleral icterus.    Extraocular Movements: Extraocular movements intact.     Conjunctiva/sclera: Conjunctivae normal.     Pupils: Pupils are equal, round, and reactive to light.  Neck:     Thyroid: No thyromegaly.  Cardiovascular:     Rate and Rhythm: Normal rate and regular rhythm.     Pulses: Normal pulses.     Heart sounds: Normal heart sounds. No murmur heard.    No friction rub.  Pulmonary:     Effort: Pulmonary effort is normal.     Breath sounds: Normal breath sounds. No wheezing, rhonchi or rales.  Abdominal:     General: Bowel sounds are normal.     Palpations: Abdomen is soft.     Tenderness: There is no abdominal tenderness.  Musculoskeletal:        General: No deformity. Normal range of motion.  Lymphadenopathy:     Cervical: No cervical adenopathy.  Skin:  General: Skin is warm and dry.     Findings: No lesion.  Neurological:     General: No focal deficit present.     Mental Status: She is alert and oriented to person, place, and time.  Psychiatric:        Mood and Affect: Mood normal.        Thought Content: Thought content normal.      No results found for any visits on 02/19/23.    Assessment & Plan:  Well adult exam -     Comprehensive metabolic panel; Future -     CBC with Differential/Platelet; Future -     TSH; Future -     T4, free; Future -     Hemoglobin A1c; Future  Osteopenia of neck of left femur -     VITAMIN D 25 Hydroxy (Vit-D Deficiency, Fractures); Future -     DG Bone Density; Future  Mixed hyperlipidemia -     Lipid panel; Future -     Atorvastatin Calcium; Take 1 tablet (10 mg total) by mouth daily.  Dispense: 90 tablet; Refill: 3  Age-appropriate health screenings discussed.  Labs ordered, patient will have done another day as lab is currently closed until 11 AM.  Bone density from 2022 with  osteopenia of left femoral neck.  Repeat bone density due this year.  Order placed.  Lifestyle modifications including weightbearing exercises and maximizing calcium and vitamin D intake encouraged.  Next CPE in 1 year.  Return if symptoms worsen or fail to improve.   Deeann Saint, MD

## 2023-02-24 ENCOUNTER — Other Ambulatory Visit (INDEPENDENT_AMBULATORY_CARE_PROVIDER_SITE_OTHER): Payer: Medicare Other

## 2023-02-24 DIAGNOSIS — E782 Mixed hyperlipidemia: Secondary | ICD-10-CM | POA: Diagnosis not present

## 2023-02-24 DIAGNOSIS — M85852 Other specified disorders of bone density and structure, left thigh: Secondary | ICD-10-CM

## 2023-02-24 DIAGNOSIS — Z0000 Encounter for general adult medical examination without abnormal findings: Secondary | ICD-10-CM | POA: Diagnosis not present

## 2023-02-24 LAB — LIPID PANEL
Cholesterol: 163 mg/dL (ref 0–200)
HDL: 55.6 mg/dL (ref >39.00)
LDL Cholesterol: 91 mg/dL (ref 0–99)
NonHDL: 107.55
Total CHOL/HDL Ratio: 3
Triglycerides: 85 mg/dL (ref 0.0–149.0)
VLDL: 17 mg/dL (ref 0.0–40.0)

## 2023-02-24 LAB — CBC WITH DIFFERENTIAL/PLATELET
Basophils Absolute: 0.1 10*3/uL (ref 0.0–0.1)
Basophils Relative: 0.8 % (ref 0.0–3.0)
Eosinophils Absolute: 0.3 10*3/uL (ref 0.0–0.7)
Eosinophils Relative: 2.9 % (ref 0.0–5.0)
HCT: 43.7 % (ref 36.0–46.0)
Hemoglobin: 14.3 g/dL (ref 12.0–15.0)
Lymphocytes Relative: 32.7 % (ref 12.0–46.0)
Lymphs Abs: 2.9 10*3/uL (ref 0.7–4.0)
MCHC: 32.8 g/dL (ref 30.0–36.0)
MCV: 92.1 fl (ref 78.0–100.0)
Monocytes Absolute: 0.7 10*3/uL (ref 0.1–1.0)
Monocytes Relative: 8.3 % (ref 3.0–12.0)
Neutro Abs: 4.9 10*3/uL (ref 1.4–7.7)
Neutrophils Relative %: 55.3 % (ref 43.0–77.0)
Platelets: 335 10*3/uL (ref 150.0–400.0)
RBC: 4.75 Mil/uL (ref 3.87–5.11)
RDW: 14.5 % (ref 11.5–15.5)
WBC: 8.8 10*3/uL (ref 4.0–10.5)

## 2023-02-24 LAB — COMPREHENSIVE METABOLIC PANEL
ALT: 20 U/L (ref 0–35)
AST: 32 U/L (ref 0–37)
Albumin: 4.3 g/dL (ref 3.5–5.2)
Alkaline Phosphatase: 85 U/L (ref 39–117)
BUN: 14 mg/dL (ref 6–23)
CO2: 28 mEq/L (ref 19–32)
Calcium: 9.8 mg/dL (ref 8.4–10.5)
Chloride: 101 mEq/L (ref 96–112)
Creatinine, Ser: 0.9 mg/dL (ref 0.40–1.20)
GFR: 64.59 mL/min (ref >60.00)
Glucose, Bld: 92 mg/dL (ref 70–99)
Potassium: 4.4 mEq/L (ref 3.5–5.1)
Sodium: 136 mEq/L (ref 135–145)
Total Bilirubin: 0.5 mg/dL (ref 0.2–1.2)
Total Protein: 7.7 g/dL (ref 6.0–8.3)

## 2023-02-24 LAB — TSH: TSH: 2.27 u[IU]/mL (ref 0.35–5.50)

## 2023-02-24 LAB — HEMOGLOBIN A1C: Hgb A1c MFr Bld: 5.8 % (ref 4.6–6.5)

## 2023-02-24 LAB — VITAMIN D 25 HYDROXY (VIT D DEFICIENCY, FRACTURES): VITD: 23.69 ng/mL — ABNORMAL LOW (ref 30.00–100.00)

## 2023-02-24 LAB — T4, FREE: Free T4: 0.9 ng/dL (ref 0.60–1.60)

## 2023-02-25 ENCOUNTER — Other Ambulatory Visit: Payer: Self-pay | Admitting: Family Medicine

## 2023-02-25 DIAGNOSIS — E559 Vitamin D deficiency, unspecified: Secondary | ICD-10-CM

## 2023-02-25 MED ORDER — VITAMIN D (ERGOCALCIFEROL) 1.25 MG (50000 UNIT) PO CAPS: 50000.0000 [IU] | ORAL_CAPSULE | ORAL | 0 refills | Status: AC

## 2023-05-14 ENCOUNTER — Encounter: Payer: Self-pay | Admitting: Adult Health

## 2023-05-14 ENCOUNTER — Ambulatory Visit: Payer: Medicare Other | Admitting: Adult Health

## 2023-05-14 DIAGNOSIS — F411 Generalized anxiety disorder: Secondary | ICD-10-CM

## 2023-05-14 DIAGNOSIS — F331 Major depressive disorder, recurrent, moderate: Secondary | ICD-10-CM

## 2023-05-14 DIAGNOSIS — G4700 Insomnia, unspecified: Secondary | ICD-10-CM | POA: Diagnosis not present

## 2023-05-14 MED ORDER — HYDROXYZINE HCL 25 MG PO TABS: ORAL_TABLET | ORAL | 5 refills | Status: DC

## 2023-05-14 NOTE — Progress Notes (Signed)
[name] [id number] 04-Apr-1951 72 y.o.  Subjective:   Patient ID:  [name] is a 72 y.o. (DOB [birth year]) female.  Chief Complaint: No chief complaint on file.   HPI [name] presents to the office today for follow-up of anxiety, depression, and insomnia.  Describes mood today as "ok". Pleasant. Denies tearfulness. Mood symptoms - denies depression, anxiety and irritability. Denies panic attacks. Reports "a little" worry, rumination, and over thinking. Mood is consistent. Stating "I feel like I'm doing alright". Feels like current medications continue to work well. She and husband doing well. Stable interest and motivation. Taking medications as prescribed.  Energy levels stable. Active, has a regular exercise routine - yoga.  Enjoys some usual interests and activities. Married. Lives with husband - 2 grown sons - one in [place] and one in [place].  Appetite adequate. Weight loss - 125 pounds. Sleeps well most nights. Averages 8 to 9 hours. Focus and concentration stable. Completing tasks. Managing aspects of household. Works part time - 12 hours. Denies SI or HI.  Denies AH or VH. Denies self harm. Denies substance use.  GAD-7    Flowsheet Row Office Visit from 02/19/2023 in [facility] [place]  Total GAD-7 Score 0      PHQ2-9    Flowsheet Row Office Visit from 02/19/2023 in [facility] [place] Clinical Support from 01/30/2023 in [facility] [place] Clinical Support from 01/21/2022 in [facility] [place] Office Visit from 11/14/2021 in [facility] [place] Clinical Support from 01/08/2021 in [facility] Total Score 0 0 0 0 0  PHQ-9 Total Score 0 -- -- 0 --        Review of Systems:  Review of Systems  Musculoskeletal:  Negative for gait problem.  Neurological:  Negative for tremors.   Psychiatric/Behavioral:         Please refer to HPI    Medications: I have reviewed the patient's current medications.  Current Outpatient Medications  Medication Sig Dispense Refill   ARIPiprazole (ABILIFY) 2 MG tablet TAKE 1 TABLET(2 MG) BY MOUTH DAILY 90 tablet 3   atorvastatin (LIPITOR) 10 MG tablet Take 1 tablet (10 mg total) by mouth daily. 90 tablet 3   Calcium Citrate-Vitamin D (CALCIUM CITRATE + D PO) Take by mouth.     escitalopram (LEXAPRO) 20 MG tablet TAKE 1 TABLET(20 MG) BY MOUTH DAILY 90 tablet 3   hydrOXYzine (ATARAX) 25 MG tablet Take one capsule up to four times a day for anxiety/sleep. 120 tablet 5   LUTEIN PO Take by mouth.     Vitamin D, Ergocalciferol, (DRISDOL) 1.25 MG (50000 UNIT) CAPS capsule Take 1 capsule (50,000 Units total) by mouth every 7 (seven) days. 12 capsule 0   No current facility-administered medications for this visit.    Medication Side Effects: None  Allergies: No Known Allergies  Past Medical History:  Diagnosis Date   Depression    Hyperlipidemia    Sciatica    Tobacco use disorder, continuous     Past Medical History, Surgical history, Social history, and Family history were reviewed and updated as appropriate.   Please see review of systems for further details on the patient's review from today.   Objective:   Physical Exam:  There were no vitals taken for this visit.  Physical Exam Constitutional:      General: She is not in acute distress.  Musculoskeletal:        General: No deformity.  Neurological:     Mental Status: She is alert and oriented to person, place, and time.     Coordination: Coordination normal.  Psychiatric:        Attention and Perception: Attention and perception normal. She does not perceive auditory or visual hallucinations.        Mood and Affect: Mood normal. Mood is not anxious or depressed. Affect is not labile, blunt, angry or inappropriate.        Speech: Speech normal.        Behavior:  Behavior normal.        Thought Content: Thought content normal. Thought content is not paranoid or delusional. Thought content does not include homicidal or suicidal ideation. Thought content does not include homicidal or suicidal plan.        Cognition and Memory: Cognition and memory normal.        Judgment: Judgment normal.     Comments: Insight intact     Lab Review:     Component Value Date/Time   NA 136 02/24/2023 0917   K 4.4 02/24/2023 0917   CL 101 02/24/2023 0917   CO2 28 02/24/2023 0917   GLUCOSE 92 02/24/2023 0917   BUN 14 02/24/2023 0917   CREATININE 0.90 02/24/2023 0917   CALCIUM 9.8 02/24/2023 0917   PROT 7.7 02/24/2023 0917   ALBUMIN 4.3 02/24/2023 0917   AST 32 02/24/2023 0917   ALT 20 02/24/2023 0917   ALKPHOS 85 02/24/2023 0917   BILITOT 0.5 02/24/2023 0917       Component Value Date/Time   WBC 8.8 02/24/2023 0917   RBC 4.75 02/24/2023 0917   HGB 14.3 02/24/2023 0917   HCT 43.7 02/24/2023 0917   PLT 335.0 02/24/2023 0917   MCV 92.1 02/24/2023 0917   MCHC 32.8 02/24/2023 0917   RDW 14.5 02/24/2023 0917   LYMPHSABS 2.9 02/24/2023 0917   MONOABS 0.7 02/24/2023 0917   EOSABS 0.3 02/24/2023 0917   BASOSABS 0.1 02/24/2023 0917    No results found for: "POCLITH", "LITHIUM"   No results found for: "PHENYTOIN", "PHENOBARB", "VALPROATE", "CBMZ"   .res Assessment: Plan:    Plan:  1. Abilify 2mg  daily 2. Hydroxyzine 25 - 4 daily prn anxiety/insomnia 3. Lexapro 20mg  daily   RTC 6 months  Patient advised to contact office with any questions, adverse effects, or acute worsening in signs and symptoms.  Discussed potential metabolic side effects associated with atypical antipsychotics, as well as potential risk for movement side effects. Advised pt to contact office if movement side effects occur.   Diagnoses and all orders for this visit:  Major depressive disorder, recurrent episode, moderate (HCC)  Insomnia, unspecified type -     hydrOXYzine  (ATARAX) 25 MG tablet; Take one capsule up to four times a day for anxiety/sleep.  Generalized anxiety disorder     Please see After Visit Summary for patient specific instructions.  Future Appointments  Date Time Provider Department Center  02/03/2024  3:00 PM [facility]NUAL WELLNESS VISIT [facility]    No orders of the defined types were placed in this encounter.   -------------------------------

## 2023-09-30 ENCOUNTER — Encounter: Payer: Medicare Other | Admitting: Family Medicine

## 2023-09-30 ENCOUNTER — Ambulatory Visit: Payer: Medicare Other

## 2023-11-10 DIAGNOSIS — K080 Exfoliation of teeth due to systemic causes: Secondary | ICD-10-CM | POA: Diagnosis not present

## 2023-11-12 ENCOUNTER — Ambulatory Visit: Payer: Medicare Other | Admitting: Adult Health

## 2023-11-17 ENCOUNTER — Encounter: Payer: Self-pay | Admitting: Adult Health

## 2023-11-17 ENCOUNTER — Ambulatory Visit: Payer: Medicare Other | Admitting: Adult Health

## 2023-11-17 DIAGNOSIS — F331 Major depressive disorder, recurrent, moderate: Secondary | ICD-10-CM

## 2023-11-17 DIAGNOSIS — G4700 Insomnia, unspecified: Secondary | ICD-10-CM

## 2023-11-17 DIAGNOSIS — F411 Generalized anxiety disorder: Secondary | ICD-10-CM | POA: Diagnosis not present

## 2023-11-17 MED ORDER — ESCITALOPRAM OXALATE 20 MG PO TABS: ORAL_TABLET | ORAL | 3 refills | Status: AC

## 2023-11-17 MED ORDER — HYDROXYZINE HCL 25 MG PO TABS: ORAL_TABLET | ORAL | 5 refills | Status: DC

## 2023-11-17 MED ORDER — ARIPIPRAZOLE 2 MG PO TABS: ORAL_TABLET | ORAL | 3 refills | Status: AC

## 2023-11-17 NOTE — Progress Notes (Signed)
 [name] [id number] [birth year] 73 y.o.  Subjective:   Patient ID:  [name] is a 73 y.o. (DOB Jul 19, 1951) female.  Chief Complaint: No chief complaint on file.   HPI [name] presents to the office today for follow-up of anxiety, depression, and insomnia.  Describes mood today as "ok". Pleasant. Denies tearfulness. Mood symptoms - denies depression, anxiety and irritability. Denies panic attacks. Reports some rumination. Denies worry and over thinking. Mood is consistent. Stating "I feel like I'm doing ok". Feels like current medications work well. Stable interest and motivation. Taking medications as prescribed.  Energy levels stable. Active, has a regular exercise routine - yoga.  Enjoys some usual interests and activities. Married. Lives with husband - 2 grown sons - one in [place] and one in [place].  Appetite adequate. Weight loss - 120 from 125 pounds. Sleeps well most nights. Averages 8 to 9 hours. Focus and concentration stable. Completing tasks. Managing aspects of household. Works part time - 12 hours. Denies SI or HI.  Denies AH or VH. Denies self harm. Denies substance use.   GAD-7    Flowsheet Row Office Visit from 02/19/2023 in [facility] [place]  Total GAD-7 Score 0      PHQ2-9    Flowsheet Row Office Visit from 02/19/2023 in [facility] [place] Clinical Support from 01/30/2023 in [facility] at [place] Clinical Support from 01/21/2022 in [facility] [place] Office Visit from 11/14/2021 in [facility] [place] Clinical Support from 01/08/2021 in [facility] Total Score 0 0 0 0 0  PHQ-9 Total Score 0 -- -- 0 --        Review of Systems:  Review of Systems  Musculoskeletal:  Negative for gait problem.  Neurological:  Negative for tremors.  Psychiatric/Behavioral:         Please refer to HPI     Medications: I have reviewed the patient's current medications.  Current Outpatient Medications  Medication Sig Dispense Refill   ARIPiprazole (ABILIFY) 2 MG tablet TAKE 1 TABLET(2 MG) BY MOUTH DAILY 90 tablet 3   atorvastatin (LIPITOR) 10 MG tablet Take 1 tablet (10 mg total) by mouth daily. 90 tablet 3   Calcium Citrate-Vitamin D (CALCIUM CITRATE + D PO) Take by mouth.     escitalopram (LEXAPRO) 20 MG tablet TAKE 1 TABLET(20 MG) BY MOUTH DAILY 90 tablet 3   hydrOXYzine (ATARAX) 25 MG tablet Take one capsule up to four times a day for anxiety/sleep. 120 tablet 5   LUTEIN PO Take by mouth.     Vitamin D, Ergocalciferol, (DRISDOL) 1.25 MG (50000 UNIT) CAPS capsule Take 1 capsule (50,000 Units total) by mouth every 7 (seven) days. 12 capsule 0   No current facility-administered medications for this visit.    Medication Side Effects: None  Allergies: No Known Allergies  Past Medical History:  Diagnosis Date   Depression    Hyperlipidemia    Sciatica    Tobacco use disorder, continuous     Past Medical History, Surgical history, Social history, and Family history were reviewed and updated as appropriate.   Please see review of systems for further details on the patient's review from today.   Objective:   Physical Exam:  There were no vitals taken for this visit.  Physical Exam Constitutional:      General: She is not in acute distress. Musculoskeletal:  General: No deformity.  Neurological:     Mental Status: She is alert and oriented to person, place, and time.     Coordination: Coordination normal.  Psychiatric:        Attention and Perception: Attention and perception normal. She does not perceive auditory or visual hallucinations.        Mood and Affect: Mood normal. Mood is not anxious or depressed. Affect is not labile, blunt, angry or inappropriate.        Speech: Speech normal.        Behavior: Behavior normal.        Thought Content: Thought content  normal. Thought content is not paranoid or delusional. Thought content does not include homicidal or suicidal ideation. Thought content does not include homicidal or suicidal plan.        Cognition and Memory: Cognition and memory normal.        Judgment: Judgment normal.     Comments: Insight intact     Lab Review:     Component Value Date/Time   NA 136 02/24/2023 0917   K 4.4 02/24/2023 0917   CL 101 02/24/2023 0917   CO2 28 02/24/2023 0917   GLUCOSE 92 02/24/2023 0917   BUN 14 02/24/2023 0917   CREATININE 0.90 02/24/2023 0917   CALCIUM 9.8 02/24/2023 0917   PROT 7.7 02/24/2023 0917   ALBUMIN 4.3 02/24/2023 0917   AST 32 02/24/2023 0917   ALT 20 02/24/2023 0917   ALKPHOS 85 02/24/2023 0917   BILITOT 0.5 02/24/2023 0917       Component Value Date/Time   WBC 8.8 02/24/2023 0917   RBC 4.75 02/24/2023 0917   HGB 14.3 02/24/2023 0917   HCT 43.7 02/24/2023 0917   PLT 335.0 02/24/2023 0917   MCV 92.1 02/24/2023 0917   MCHC 32.8 02/24/2023 0917   RDW 14.5 02/24/2023 0917   LYMPHSABS 2.9 02/24/2023 0917   MONOABS 0.7 02/24/2023 0917   EOSABS 0.3 02/24/2023 0917   BASOSABS 0.1 02/24/2023 0917    No results found for: "POCLITH", "LITHIUM"   No results found for: "PHENYTOIN", "PHENOBARB", "VALPROATE", "CBMZ"   .res Assessment: Plan:    Plan:  1. Abilify 2mg  daily 2. Hydroxyzine 25 - 4 daily prn anxiety/insomnia 3. Lexapro 20mg  daily   RTC 6 months  Patient advised to contact office with any questions, adverse effects, or acute worsening in signs and symptoms.  Discussed potential metabolic side effects associated with atypical antipsychotics, as well as potential risk for movement side effects. Advised pt to contact office if movement side effects occur.   There are no diagnoses linked to this encounter.   Please see After Visit Summary for patient specific instructions.  Future Appointments  Date Time Provider Department Center  02/03/2024  3:00 PM [facility]ANNUAL  WELLNESS VISIT [facility]  02/22/2024 10:00 AM Viola Greulich, MD [facility]    No orders of the defined types were placed in this encounter.   -------------------------------

## 2023-11-20 ENCOUNTER — Other Ambulatory Visit: Payer: Self-pay | Admitting: Adult Health

## 2023-11-20 DIAGNOSIS — F331 Major depressive disorder, recurrent, moderate: Secondary | ICD-10-CM

## 2023-11-20 DIAGNOSIS — F411 Generalized anxiety disorder: Secondary | ICD-10-CM

## 2023-11-24 ENCOUNTER — Telehealth: Payer: Self-pay

## 2023-11-24 NOTE — Telephone Encounter (Signed)
 Prior approval received effective through 11/23/24 for Hydroxyzine  25 mg

## 2023-11-24 NOTE — Telephone Encounter (Signed)
 Prior Authorization initiated with [employer] Part D for Hydroxyzine  25 mg tabs #120 for 30 day,

## 2024-02-22 ENCOUNTER — Encounter: Payer: Self-pay | Admitting: Family Medicine

## 2024-02-22 ENCOUNTER — Ambulatory Visit (INDEPENDENT_AMBULATORY_CARE_PROVIDER_SITE_OTHER): Payer: Medicare Other | Admitting: Family Medicine

## 2024-02-22 VITALS — BP 158/92 | HR 72 | Temp 98.8°F | Ht 63.0 in | Wt 122.2 lb

## 2024-02-22 DIAGNOSIS — R030 Elevated blood-pressure reading, without diagnosis of hypertension: Secondary | ICD-10-CM | POA: Diagnosis not present

## 2024-02-22 DIAGNOSIS — E782 Mixed hyperlipidemia: Secondary | ICD-10-CM | POA: Diagnosis not present

## 2024-02-22 DIAGNOSIS — Z1231 Encounter for screening mammogram for malignant neoplasm of breast: Secondary | ICD-10-CM

## 2024-02-22 DIAGNOSIS — E559 Vitamin D deficiency, unspecified: Secondary | ICD-10-CM | POA: Diagnosis not present

## 2024-02-22 DIAGNOSIS — M85852 Other specified disorders of bone density and structure, left thigh: Secondary | ICD-10-CM | POA: Diagnosis not present

## 2024-02-22 DIAGNOSIS — Z1211 Encounter for screening for malignant neoplasm of colon: Secondary | ICD-10-CM

## 2024-02-22 DIAGNOSIS — Z0000 Encounter for general adult medical examination without abnormal findings: Secondary | ICD-10-CM | POA: Diagnosis not present

## 2024-02-22 NOTE — Progress Notes (Signed)
 Established Patient Office Visit   Subjective  Patient ID: [name], female    DOB: [birth year]  Age: 73 y.o. MRN: [id number]  Chief Complaint  Patient presents with   Annual Exam    Patient is a 73 year old female seen for CPE.  Patient is not fasting.  Last colonoscopy 02/2020 with 3-year recall advised.  Patient now ready to have repeat done.  Also requesting order for mammogram.  Patient initially hesitant due to history of anxiety.  Patient endorses feeling a little more tired than normal but attributes it to age.  Previously had vitamin D  deficiency.  Unsure if noted a difference in energy when taking weekly ergocalciferol .  Patient states she is stable on medications for anxiety and depression including Abilify  and Lexapro .  Patient states she is enjoying spending time with her husband who is supportive and her granddaughter who is in [place].  Patient also working part-time.    Patient Active Problem List   Diagnosis Date Noted   Tobacco use disorder, continuous 10/21/2019   Major depressive disorder, recurrent episode, moderate (HCC) 10/21/2019   Hyperlipidemia 10/07/2016   Elevated blood-pressure reading without diagnosis of hypertension 10/07/2016   Cigarette smoker 10/07/2016   Anxiety disorder 10/07/2016   Past Medical History:  Diagnosis Date   Depression    Hyperlipidemia    Sciatica    Tobacco use disorder, continuous    Past Surgical History:  Procedure Laterality Date   CESAREAN SECTION     fractured heel bone     Social History   Tobacco Use   Smoking status: Every Day    Current packs/day: 1.00    Types: Cigarettes   Smokeless tobacco: Never  Vaping Use   Vaping status: Never Used  Substance Use Topics   Alcohol use: No   Drug use: No   Family History  Problem Relation Age of Onset   Colon cancer Neg Hx    Colon polyps Neg Hx    Esophageal cancer Neg Hx    Stomach cancer Neg Hx    Rectal cancer Neg Hx    Breast cancer Neg Hx     No Known Allergies  ROS Negative unless stated above    Objective:     BP (!) 158/92 (BP Location: Left Arm, Patient Position: Sitting, Cuff Size: Normal)   Pulse 72   Temp 98.8 F (37.1 C) (Oral)   Ht 5' 3 (1.6 m)   Wt 122 lb 3.2 oz (55.4 kg)   SpO2 97%   BMI 21.65 kg/m  BP Readings from Last 3 Encounters:  02/22/24 (!) 158/92  02/19/23 138/86  01/30/23 120/60   Wt Readings from Last 3 Encounters:  02/22/24 122 lb 3.2 oz (55.4 kg)  02/19/23 141 lb 6.4 oz (64.1 kg)  01/30/23 143 lb 1.6 oz (64.9 kg)      Physical Exam Constitutional:      Appearance: Normal appearance.  HENT:     Head: Normocephalic and atraumatic.     Right Ear: Tympanic membrane, ear canal and external ear normal.     Left Ear: Tympanic membrane, ear canal and external ear normal.     Nose: Nose normal.     Mouth/Throat:     Mouth: Mucous membranes are moist.     Pharynx: No oropharyngeal exudate or posterior oropharyngeal erythema.  Eyes:     General: No scleral icterus.    Extraocular Movements: Extraocular movements intact.     Conjunctiva/sclera: Conjunctivae normal.  Pupils: Pupils are equal, round, and reactive to light.  Neck:     Thyroid : No thyromegaly.  Cardiovascular:     Rate and Rhythm: Normal rate and regular rhythm.     Pulses: Normal pulses.     Heart sounds: Normal heart sounds. No murmur heard.    No friction rub.  Pulmonary:     Effort: Pulmonary effort is normal.     Breath sounds: Normal breath sounds. No wheezing, rhonchi or rales.  Abdominal:     General: Bowel sounds are normal.     Palpations: Abdomen is soft.     Tenderness: There is no abdominal tenderness.  Musculoskeletal:        General: No deformity. Normal range of motion.  Lymphadenopathy:     Cervical: No cervical adenopathy.  Skin:    General: Skin is warm and dry.     Findings: No lesion.  Neurological:     General: No focal deficit present.     Mental Status: She is alert and oriented  to person, place, and time.  Psychiatric:        Mood and Affect: Mood normal.        Thought Content: Thought content normal.        02/22/2024    9:56 AM 02/19/2023   10:02 AM 01/30/2023    2:59 PM  Depression screen PHQ 2/9  Decreased Interest 0 0 0  Down, Depressed, Hopeless 0 0 0  PHQ - 2 Score 0 0 0  Altered sleeping 0 0   Tired, decreased energy 1 0   Change in appetite 0 0   Feeling bad or failure about yourself  0 0   Trouble concentrating 0 0   Moving slowly or fidgety/restless 0 0   Suicidal thoughts 0 0   PHQ-9 Score 1 0   Difficult doing work/chores Not difficult at all        02/19/2023   10:02 AM  GAD 7 : Generalized Anxiety Score  Nervous, Anxious, on Edge 0  Control/stop worrying 0  Worry too much - different things 0  Trouble relaxing 0  Restless 0  Easily annoyed or irritable 0  Afraid - awful might happen 0  Total GAD 7 Score 0     No results found for any visits on 02/22/24.    Assessment & Plan:   Well adult exam -     CBC with Differential/Platelet; Future -     TSH; Future -     T4, free; Future -     Hemoglobin A1c; Future -     Lipid panel; Future -     Comprehensive metabolic panel with GFR; Future  Encounter for screening mammogram for malignant neoplasm of breast -     3D Screening Mammogram, Left and Right; Future  Screening for colon cancer -     Ambulatory referral to Gastroenterology  Elevated blood-pressure reading without diagnosis of hypertension -     TSH; Future -     T4, free; Future -     Comprehensive metabolic panel with GFR; Future  Vitamin D  deficiency -     VITAMIN D  25 Hydroxy (Vit-D Deficiency, Fractures); Future  Mixed hyperlipidemia -     Lipid panel; Future -     Comprehensive metabolic panel with GFR; Future  Osteopenia of neck of left femur -     DG Bone Density; Future  Age-appropriate health screenings discussed.  Patient will have labs drawn when fasting.  Orders placed.  Order for bone  density, mammogram, colonoscopy placed.  Immunizations reviewed.  Patient wishes to have shingles vaccine done at local pharmacy.  BP typically initially elevated at visits due to anxiety.  Patient left prior to recheck.    On day of service, 33 minutes spent caring for this patient face-to-face, reviewing the chart, counseling and/or coordinating care for plan and treatment of diagnosis below.    Return in about 1 year (around 02/21/2025) for physical.  Sooner if needed for other concerns.  Clotilda JONELLE Single, MD

## 2024-02-22 NOTE — Patient Instructions (Addendum)
 Orders for your labs were placed.  You can have them drawn 1 morning at your convenience.  The lab opens at 7:30 AM and closes at 4:30 PM on M-Thrus, and 4:15 pm on Fridays.

## 2024-03-01 ENCOUNTER — Other Ambulatory Visit (INDEPENDENT_AMBULATORY_CARE_PROVIDER_SITE_OTHER)

## 2024-03-01 DIAGNOSIS — Z0000 Encounter for general adult medical examination without abnormal findings: Secondary | ICD-10-CM

## 2024-03-01 DIAGNOSIS — E559 Vitamin D deficiency, unspecified: Secondary | ICD-10-CM | POA: Diagnosis not present

## 2024-03-01 DIAGNOSIS — Z131 Encounter for screening for diabetes mellitus: Secondary | ICD-10-CM | POA: Diagnosis not present

## 2024-03-01 DIAGNOSIS — R030 Elevated blood-pressure reading, without diagnosis of hypertension: Secondary | ICD-10-CM

## 2024-03-01 DIAGNOSIS — E782 Mixed hyperlipidemia: Secondary | ICD-10-CM

## 2024-03-01 LAB — LIPID PANEL
Cholesterol: 169 mg/dL (ref 0–200)
HDL: 65.4 mg/dL (ref >39.00)
LDL Cholesterol: 88 mg/dL (ref 0–99)
NonHDL: 103.59
Total CHOL/HDL Ratio: 3
Triglycerides: 79 mg/dL (ref 0.0–149.0)
VLDL: 15.8 mg/dL (ref 0.0–40.0)

## 2024-03-01 LAB — COMPREHENSIVE METABOLIC PANEL WITH GFR
ALT: 17 U/L (ref 0–35)
AST: 23 U/L (ref 0–37)
Albumin: 4.3 g/dL (ref 3.5–5.2)
Alkaline Phosphatase: 91 U/L (ref 39–117)
BUN: 14 mg/dL (ref 6–23)
CO2: 26 meq/L (ref 19–32)
Calcium: 9.4 mg/dL (ref 8.4–10.5)
Chloride: 102 meq/L (ref 96–112)
Creatinine, Ser: 0.79 mg/dL (ref 0.40–1.20)
GFR: 74.99 mL/min (ref >60.00)
Glucose, Bld: 94 mg/dL (ref 70–99)
Potassium: 3.8 meq/L (ref 3.5–5.1)
Sodium: 139 meq/L (ref 135–145)
Total Bilirubin: 0.7 mg/dL (ref 0.2–1.2)
Total Protein: 7.2 g/dL (ref 6.0–8.3)

## 2024-03-01 LAB — CBC WITH DIFFERENTIAL/PLATELET
Basophils Absolute: 0.1 K/uL (ref 0.0–0.1)
Basophils Relative: 0.6 % (ref 0.0–3.0)
Eosinophils Absolute: 0.2 K/uL (ref 0.0–0.7)
Eosinophils Relative: 2.1 % (ref 0.0–5.0)
HCT: 41.9 % (ref 36.0–46.0)
Hemoglobin: 13.9 g/dL (ref 12.0–15.0)
Lymphocytes Relative: 26.4 % (ref 12.0–46.0)
Lymphs Abs: 2.3 K/uL (ref 0.7–4.0)
MCHC: 33.2 g/dL (ref 30.0–36.0)
MCV: 89.4 fl (ref 78.0–100.0)
Monocytes Absolute: 0.6 K/uL (ref 0.1–1.0)
Monocytes Relative: 7.4 % (ref 3.0–12.0)
Neutro Abs: 5.4 K/uL (ref 1.4–7.7)
Neutrophils Relative %: 63.5 % (ref 43.0–77.0)
Platelets: 309 K/uL (ref 150.0–400.0)
RBC: 4.69 Mil/uL (ref 3.87–5.11)
RDW: 13.9 % (ref 11.5–15.5)
WBC: 8.6 K/uL (ref 4.0–10.5)

## 2024-03-01 LAB — T4, FREE: Free T4: 0.81 ng/dL (ref 0.60–1.60)

## 2024-03-01 LAB — TSH: TSH: 2.11 u[IU]/mL (ref 0.35–5.50)

## 2024-03-01 LAB — HEMOGLOBIN A1C: Hgb A1c MFr Bld: 6 % (ref 4.6–6.5)

## 2024-03-01 LAB — VITAMIN D 25 HYDROXY (VIT D DEFICIENCY, FRACTURES): VITD: 78.52 ng/mL (ref 30.00–100.00)

## 2024-03-02 ENCOUNTER — Ambulatory Visit: Payer: Self-pay | Admitting: Family Medicine

## 2024-03-04 ENCOUNTER — Ambulatory Visit

## 2024-03-25 ENCOUNTER — Ambulatory Visit
Admission: RE | Admit: 2024-03-25 | Discharge: 2024-03-25 | Disposition: A | Discharge status: Home / Self Care | Source: Ambulatory Visit | Attending: Family Medicine | Admitting: Family Medicine

## 2024-03-25 DIAGNOSIS — Z1231 Encounter for screening mammogram for malignant neoplasm of breast: Secondary | ICD-10-CM

## 2024-03-30 ENCOUNTER — Other Ambulatory Visit: Payer: Self-pay | Admitting: Family Medicine

## 2024-03-30 DIAGNOSIS — R928 Other abnormal and inconclusive findings on diagnostic imaging of breast: Secondary | ICD-10-CM

## 2024-04-12 ENCOUNTER — Other Ambulatory Visit: Payer: Self-pay | Admitting: Family Medicine

## 2024-04-12 DIAGNOSIS — E782 Mixed hyperlipidemia: Secondary | ICD-10-CM

## 2024-05-03 ENCOUNTER — Ambulatory Visit
Admission: RE | Admit: 2024-05-03 | Discharge: 2024-05-03 | Disposition: A | Discharge status: Home / Self Care | Source: Ambulatory Visit | Attending: Family Medicine | Admitting: Family Medicine

## 2024-05-03 ENCOUNTER — Other Ambulatory Visit: Payer: Self-pay | Admitting: Family Medicine

## 2024-05-03 ENCOUNTER — Ambulatory Visit
Admission: RE | Admit: 2024-05-03 | Discharge: 2024-05-03 | Disposition: A | Source: Ambulatory Visit | Attending: Family Medicine

## 2024-05-03 DIAGNOSIS — R928 Other abnormal and inconclusive findings on diagnostic imaging of breast: Secondary | ICD-10-CM

## 2024-05-03 DIAGNOSIS — N6489 Other specified disorders of breast: Secondary | ICD-10-CM

## 2024-05-10 ENCOUNTER — Encounter: Payer: Self-pay | Admitting: Family Medicine

## 2024-05-17 ENCOUNTER — Encounter: Payer: Self-pay | Admitting: Adult Health

## 2024-05-17 ENCOUNTER — Ambulatory Visit: Admitting: Adult Health

## 2024-05-17 DIAGNOSIS — G4700 Insomnia, unspecified: Secondary | ICD-10-CM

## 2024-05-17 DIAGNOSIS — F419 Anxiety disorder, unspecified: Secondary | ICD-10-CM

## 2024-05-17 DIAGNOSIS — F32A Depression, unspecified: Secondary | ICD-10-CM | POA: Diagnosis not present

## 2024-05-17 DIAGNOSIS — F411 Generalized anxiety disorder: Secondary | ICD-10-CM

## 2024-05-17 DIAGNOSIS — F331 Major depressive disorder, recurrent, moderate: Secondary | ICD-10-CM

## 2024-05-17 MED ORDER — HYDROXYZINE HCL 25 MG PO TABS: ORAL_TABLET | ORAL | 5 refills | Status: DC

## 2024-05-17 NOTE — Progress Notes (Signed)
 [name] [id number] Dec 07, 1950 73 y.o.  Subjective:   Patient ID:  [name] is a 73 y.o. (DOB [birth year]) female.  Chief Complaint: No chief complaint on file.   HPI MYKENNA VIELE presents to the office today for follow-up of anxiety, depression, and insomnia.  Describes mood today as ok. Pleasant. Denies tearfulness. Mood symptoms - denies depression, anxiety and irritability. Reports stable interest and motivation. Reports some loneliness - because I have to entertain herself - has a few good friends. Denies panic attacks. Reports some rumination. Denies worry and over thinking. Reports mood is stable. Stating I feel like I'm doing alright. Feels like current medications work well. Taking medications as prescribed.  Energy levels stable. Active, has a regular exercise routine - yoga.  Enjoys some usual interests and activities. Married. Lives with husband - 2 grown sons - one in [place] and one in [place].  Appetite adequate. Weight loss - 120 from 125 pounds. Sleeps well most nights. Averages 8 to 9 hours. Focus and concentration stable. Completing tasks. Managing aspects of household. Works part time - 12 hours. Denies SI or HI.  Denies AH or VH. Denies self harm. Denies substance use.   GAD-7    Flowsheet Row Office Visit from 02/19/2023 in [facility] [place]  Total GAD-7 Score 0   PHQ2-9    Flowsheet Row Office Visit from 02/22/2024 in [facility] [place] Office Visit from 02/19/2023 in [facility] [place] Clinical Support from 01/30/2023 in [facility] [place] Clinical Support from 01/21/2022 in [facility] [place] Office Visit from 11/14/2021 in [facility] Total Score 0 0 0 0 0  PHQ-9 Total Score 1 0 -- -- 0     Review of Systems:  Review of Systems  Musculoskeletal:  Negative for gait problem.   Neurological:  Negative for tremors.  Psychiatric/Behavioral:         Please refer to HPI    Medications: I have reviewed the patient's current medications.  Current Outpatient Medications  Medication Sig Dispense Refill   ARIPiprazole  (ABILIFY ) 2 MG tablet TAKE 1 TABLET(2 MG) BY MOUTH DAILY 90 tablet 3   atorvastatin  (LIPITOR) 10 MG tablet TAKE 1 TABLET(10 MG) BY MOUTH DAILY 90 tablet 3   Cholecalciferol (VITAMIN D3) 25 MCG (1000 UT) CAPS Take by mouth in the morning and at bedtime.     escitalopram  (LEXAPRO ) 20 MG tablet TAKE 1 TABLET(20 MG) BY MOUTH DAILY 90 tablet 3   hydrOXYzine  (ATARAX ) 25 MG tablet Take one capsule up to four times a day for anxiety/sleep. 120 tablet 5   LUTEIN PO Take by mouth.     Vitamin D , Ergocalciferol , (DRISDOL ) 1.25 MG (50000 UNIT) CAPS capsule Take 1 capsule (50,000 Units total) by mouth every 7 (seven) days. (Patient not taking: Reported on 02/22/2024) 12 capsule 0   No current facility-administered medications for this visit.    Medication Side Effects: None  Allergies: No Known Allergies  Past Medical History:  Diagnosis Date   Depression    Hyperlipidemia    Sciatica    Tobacco use disorder, continuous     Past Medical History, Surgical history, Social history, and Family history were reviewed and updated as appropriate.   Please see review of systems for further details on the patient's review from today.   Objective:   Physical Exam:  There were no vitals taken for  this visit.  Physical Exam Constitutional:      General: She is not in acute distress. Musculoskeletal:        General: No deformity.  Neurological:     Mental Status: She is alert and oriented to person, place, and time.     Coordination: Coordination normal.  Psychiatric:        Attention and Perception: Attention and perception normal. She does not perceive auditory or visual hallucinations.        Mood and Affect: Mood normal. Mood is not anxious or depressed.  Affect is not labile, blunt, angry or inappropriate.        Speech: Speech normal.        Behavior: Behavior normal.        Thought Content: Thought content normal. Thought content is not paranoid or delusional. Thought content does not include homicidal or suicidal ideation. Thought content does not include homicidal or suicidal plan.        Cognition and Memory: Cognition and memory normal.        Judgment: Judgment normal.     Comments: Insight intact     Lab Review:     Component Value Date/Time   NA 139 03/01/2024 0834   K 3.8 03/01/2024 0834   CL 102 03/01/2024 0834   CO2 26 03/01/2024 0834   GLUCOSE 94 03/01/2024 0834   BUN 14 03/01/2024 0834   CREATININE 0.79 03/01/2024 0834   CALCIUM  9.4 03/01/2024 0834   PROT 7.2 03/01/2024 0834   ALBUMIN 4.3 03/01/2024 0834   AST 23 03/01/2024 0834   ALT 17 03/01/2024 0834   ALKPHOS 91 03/01/2024 0834   BILITOT 0.7 03/01/2024 0834       Component Value Date/Time   WBC 8.6 03/01/2024 0834   RBC 4.69 03/01/2024 0834   HGB 13.9 03/01/2024 0834   HCT 41.9 03/01/2024 0834   PLT 309.0 03/01/2024 0834   MCV 89.4 03/01/2024 0834   MCHC 33.2 03/01/2024 0834   RDW 13.9 03/01/2024 0834   LYMPHSABS 2.3 03/01/2024 0834   MONOABS 0.6 03/01/2024 0834   EOSABS 0.2 03/01/2024 0834   BASOSABS 0.1 03/01/2024 0834    No results found for: POCLITH, LITHIUM   No results found for: PHENYTOIN, PHENOBARB, VALPROATE, CBMZ   .res Assessment: Plan:    Plan:  1. Abilify  2mg  daily 2. Hydroxyzine  25 - 4 daily prn anxiety/insomnia 3. Lexapro  20mg  daily   RTC 6 months  Patient advised to contact office with any questions, adverse effects, or acute worsening in signs and symptoms.  Discussed potential metabolic side effects associated with atypical antipsychotics, as well as potential risk for movement side effects. Advised pt to contact office if movement side effects occur.   There are no diagnoses linked to this encounter.    Please see After Visit Summary for patient specific instructions.  Future Appointments  Date Time Provider Department Center  06/13/2024 10:00 AM [facility]-ANNUAL WELLNESS VISIT [facility] [address]  11/02/2024  3:20 PM [facility]    No orders of the defined types were placed in this encounter.   -------------------------------

## 2024-05-26 DIAGNOSIS — K080 Exfoliation of teeth due to systemic causes: Secondary | ICD-10-CM | POA: Diagnosis not present

## 2024-06-13 ENCOUNTER — Ambulatory Visit

## 2024-06-13 VITALS — BP 122/62 | HR 60 | Temp 97.6°F | Ht 63.0 in | Wt 126.2 lb

## 2024-06-13 DIAGNOSIS — Z0000 Encounter for general adult medical examination without abnormal findings: Secondary | ICD-10-CM | POA: Diagnosis not present

## 2024-06-13 NOTE — Progress Notes (Signed)
 Subjective:   [name] is a 73 y.o. female who presents for a Welcome to Medicare Exam.   Allergies (verified) Patient has no known allergies.   History: Past Medical History:  Diagnosis Date   Depression    Hyperlipidemia    Sciatica    Tobacco use disorder, continuous    Past Surgical History:  Procedure Laterality Date   CESAREAN SECTION     fractured heel bone     Family History  Problem Relation Age of Onset   Colon cancer Neg Hx    Colon polyps Neg Hx    Esophageal cancer Neg Hx    Stomach cancer Neg Hx    Rectal cancer Neg Hx    Breast cancer Neg Hx    BRCA 1/2 Neg Hx    Social History   Occupational History   Not on file  Tobacco Use   Smoking status: Every Day    Current packs/day: 1.00    Types: Cigarettes   Smokeless tobacco: Never  Vaping Use   Vaping status: Never Used  Substance and Sexual Activity   Alcohol use: No   Drug use: No   Sexual activity: Not on file   Tobacco Counseling Ready to quit: No Counseling given: Yes  SDOH Screenings   Food Insecurity: No Food Insecurity (06/13/2024)  Housing: Unknown (06/13/2024)  Transportation Needs: No Transportation Needs (06/13/2024)  Utilities: Not At Risk (06/13/2024)  Alcohol Screen: Low Risk  (06/06/2024)  Depression (PHQ2-9): Low Risk  (06/13/2024)  Financial Resource Strain: Low Risk  (06/06/2024)  Physical Activity: Insufficiently Active (06/13/2024)  Social Connections: Moderately Isolated (06/13/2024)  Stress: Stress Concern Present (06/13/2024)  Tobacco Use: High Risk (06/13/2024)  Health Literacy: Adequate Health Literacy (06/13/2024)   Depression Screen    06/13/2024   10:21 AM 02/22/2024    9:56 AM 02/19/2023   10:02 AM 01/30/2023    2:59 PM 01/21/2022    3:17 PM 11/14/2021    9:42 AM 01/08/2021    3:25 PM  PHQ 2/9 Scores  PHQ - 2 Score 0 0 0 0 0 0 0  PHQ- 9 Score  1  0    0       Data saved with a previous flowsheet row definition     Goals Addressed   None     Visit info / Clinical Intake: Medicare Wellness Visit Type:: Subsequent Annual Wellness Visit Medicare Wellness Visit Mode:: In-person (required for WTM) Interpreter Needed?: No Pre-visit prep was completed: no AWV questionnaire completed by patient prior to visit?: no Living arrangements:: lives with spouse/significant other Patient's Overall Health Status Rating: excellent Typical amount of pain: none Does pain affect daily life?: no Are you currently prescribed opioids?: no  Dietary Habits and Nutritional Risks How many meals a day?: 3 Eats fruit and vegetables daily?: yes Most meals are obtained by: preparing own meals Diabetic:: no  Functional Status Activities of Daily Living (to include ambulation/medication): Independent Ambulation: Independent with device- listed below Home Assistive Devices/Equipment: Eyeglasses Medication Administration: Independent Home Management: Independent Manage your own finances?: yes Primary transportation is: driving Concerns about vision?: no *vision screening is required for WTM* Concerns about hearing?: no  Fall Screening Falls in the past year?: 0 Number of falls in past year: 0 Was there an injury with Fall?: 0 Fall Risk Category Calculator: 0 Patient Fall Risk Level: Low Fall Risk  Fall Risk Patient at Risk for Falls Due to: No Fall Risks  Home and Transportation Safety: All  rugs have non-skid backing?: yes All stairs or steps have railings?: yes Grab bars in the bathtub or shower?: (!) no Have non-skid surface in bathtub or shower?: yes Good home lighting?: yes Regular seat belt use?: yes Hospital stays in the last year:: no  Cognitive Assessment Difficulty concentrating, remembering, or making decisions? : no Will 6CIT or Mini Cog be Completed: yes  Advance Directives (For Healthcare) Does Patient Have a Medical Advance Directive?: Yes Does patient want to make changes to medical advance directive?: No - Patient  declined Type of Advance Directive: Healthcare Power of [place]; Living will Copy of Healthcare Power of Attorney in Chart?: No - copy requested         Objective:    Today's Vitals   06/13/24 1001  BP: 122/62  Pulse: 60  Temp: 97.6 F (36.4 C)  TempSrc: Oral  SpO2: 97%  Weight: 126 lb 3.2 oz (57.2 kg)  Height: 5' 3 (1.6 m)   Body mass index is 22.36 kg/m.   Physical Exam   Current Medications (verified) Outpatient Encounter Medications as of 06/13/2024  Medication Sig   ARIPiprazole  (ABILIFY ) 2 MG tablet TAKE 1 TABLET(2 MG) BY MOUTH DAILY   atorvastatin  (LIPITOR) 10 MG tablet TAKE 1 TABLET(10 MG) BY MOUTH DAILY   Cholecalciferol (VITAMIN D3) 25 MCG (1000 UT) CAPS Take by mouth in the morning and at bedtime.   escitalopram  (LEXAPRO ) 20 MG tablet TAKE 1 TABLET(20 MG) BY MOUTH DAILY   hydrOXYzine  (ATARAX ) 25 MG tablet Take one capsule up to four times a day for anxiety/sleep.   LUTEIN PO Take by mouth.   Vitamin D , Ergocalciferol , (DRISDOL ) 1.25 MG (50000 UNIT) CAPS capsule Take 1 capsule (50,000 Units total) by mouth every 7 (seven) days. (Patient not taking: Reported on 02/22/2024)   No facility-administered encounter medications on file as of 06/13/2024.   Hearing/Vision screen No results found. Immunizations and Health Maintenance Health Maintenance  Topic Date Due   Colonoscopy  10/27/2022   Influenza Vaccine  03/04/2024   COVID-19 Vaccine (7 - 2025-26 season) 04/04/2024   Zoster Vaccines- Shingrix (2 of 2) 08/04/2024   Medicare Annual Wellness (AWV)  06/13/2025   Mammogram  03/25/2026   DTaP/Tdap/Td (2 - Td or Tdap) 10/19/2029   Pneumococcal Vaccine: 50+ Years  Completed   DEXA SCAN  Completed   Hepatitis C Screening  Completed   Meningococcal B Vaccine  Aged Out         Assessment/Plan:  This is a routine wellness examination for [name].  Patient Care Team: Mercer Clotilda SAUNDERS, MD as PCP - General (Family Medicine)  I have personally reviewed and  noted the following in the patient's chart:   Medical and social history Use of alcohol, tobacco or illicit drugs  Current medications and supplements including opioid prescriptions. Functional ability and status Nutritional status Physical activity Advanced directives List of other physicians Hospitalizations, surgeries, and ER visits in previous 12 months Vitals Screenings to include cognitive, depression, and falls Referrals and appointments  No orders of the defined types were placed in this encounter.  In addition, I have reviewed and discussed with patient certain preventive protocols, quality metrics, and best practice recommendations. A written personalized care plan for preventive services as well as general preventive health recommendations were provided to patient.   Rojelio LELON Blush, LPN   [id number]/[id number]   Return in 1 year (on 06/13/2025).

## 2024-06-13 NOTE — Patient Instructions (Addendum)
 Ms. Buras,  Thank you for taking the time for your Medicare Wellness Visit. I appreciate your continued commitment to your health goals. Please review the care plan we discussed, and feel free to reach out if I can assist you further.  Please note that Annual Wellness Visits do not include a physical exam. Some assessments may be limited, especially if the visit was conducted virtually. If needed, we may recommend an in-person follow-up with your provider.  Ongoing Care Seeing your primary care provider every 3 to 6 months helps us  monitor your health and provide consistent, personalized care.   Referrals If a referral was made during today's visit and you haven't received any updates within two weeks, please contact the referred provider directly to check on the status.  Recommended Screenings:  Health Maintenance  Topic Date Due   Colon Cancer Screening  10/27/2022   Flu Shot  03/04/2024   COVID-19 Vaccine (7 - 2025-26 season) 04/04/2024   Zoster (Shingles) Vaccine (2 of 2) 08/04/2024   Medicare Annual Wellness Visit  06/13/2025   Breast Cancer Screening  03/25/2026   DTaP/Tdap/Td vaccine (2 - Td or Tdap) 10/19/2029   Pneumococcal Vaccine for age over 9  Completed   DEXA scan (bone density measurement)  Completed   Hepatitis C Screening  Completed   Meningitis B Vaccine  Aged Out       06/13/2024   10:16 AM  Advanced Directives  Does Patient Have a Medical Advance Directive? Yes  Type of Estate Agent of [place];Living will  Does patient want to make changes to medical advance directive? No - Patient declined  Copy of Healthcare Power of Attorney in Chart? No - copy requested    Vision: Annual vision screenings are recommended for early detection of glaucoma, cataracts, and diabetic retinopathy. These exams can also reveal signs of chronic conditions such as diabetes and high blood pressure.  Dental: Annual dental screenings help detect early signs  of oral cancer, gum disease, and other conditions linked to overall health, including heart disease and diabetes.  Please see the attached documents for additional preventive care recommendations.

## 2024-07-23 ENCOUNTER — Other Ambulatory Visit: Payer: Self-pay | Admitting: Adult Health

## 2024-07-23 DIAGNOSIS — G4700 Insomnia, unspecified: Secondary | ICD-10-CM

## 2024-11-02 ENCOUNTER — Encounter

## 2024-11-15 ENCOUNTER — Ambulatory Visit: Admitting: Adult Health
